# Patient Record
Sex: Male | Born: 1935 | Race: White | Hispanic: No | Smoking: Former smoker
Health system: Southern US, Community
[De-identification: ages and names within clinical notes are randomized; demographics above are authoritative.]

## PROBLEM LIST (undated history)

## (undated) DIAGNOSIS — M1990 Unspecified osteoarthritis, unspecified site: Secondary | ICD-10-CM

## (undated) DIAGNOSIS — Z8489 Family history of other specified conditions: Secondary | ICD-10-CM

## (undated) DIAGNOSIS — I5032 Chronic diastolic (congestive) heart failure: Secondary | ICD-10-CM

## (undated) DIAGNOSIS — I10 Essential (primary) hypertension: Secondary | ICD-10-CM

## (undated) DIAGNOSIS — E785 Hyperlipidemia, unspecified: Secondary | ICD-10-CM

## (undated) HISTORY — DX: Essential (primary) hypertension: I10

## (undated) HISTORY — PX: OTHER SURGICAL HISTORY: SHX169

## (undated) HISTORY — DX: Chronic diastolic (congestive) heart failure: I50.32

## (undated) HISTORY — PX: TOTAL KNEE ARTHROPLASTY: SHX125

## (undated) HISTORY — DX: Hyperlipidemia, unspecified: E78.5

---

## 2001-01-20 ENCOUNTER — Emergency Department (HOSPITAL_COMMUNITY): Admission: EM | Admit: 2001-01-20 | Discharge: 2001-01-20 | Payer: Self-pay | Admitting: Internal Medicine

## 2001-01-22 ENCOUNTER — Emergency Department (HOSPITAL_COMMUNITY): Admission: EM | Admit: 2001-01-22 | Discharge: 2001-01-22 | Payer: Self-pay | Admitting: Internal Medicine

## 2002-05-24 ENCOUNTER — Ambulatory Visit (HOSPITAL_COMMUNITY): Admission: RE | Admit: 2002-05-24 | Discharge: 2002-05-24 | Payer: Self-pay | Admitting: *Deleted

## 2002-05-24 ENCOUNTER — Encounter (INDEPENDENT_AMBULATORY_CARE_PROVIDER_SITE_OTHER): Payer: Self-pay

## 2004-06-27 ENCOUNTER — Emergency Department (HOSPITAL_COMMUNITY): Admission: EM | Admit: 2004-06-27 | Discharge: 2004-06-27 | Payer: Self-pay | Admitting: Emergency Medicine

## 2005-01-01 ENCOUNTER — Ambulatory Visit (HOSPITAL_COMMUNITY): Admission: RE | Admit: 2005-01-01 | Discharge: 2005-01-01 | Payer: Self-pay | Admitting: *Deleted

## 2005-01-01 ENCOUNTER — Encounter (INDEPENDENT_AMBULATORY_CARE_PROVIDER_SITE_OTHER): Payer: Self-pay | Admitting: Specialist

## 2005-08-14 ENCOUNTER — Ambulatory Visit (HOSPITAL_COMMUNITY): Admission: RE | Admit: 2005-08-14 | Discharge: 2005-08-14 | Payer: Self-pay | Admitting: Chiropractic Medicine

## 2006-04-13 ENCOUNTER — Ambulatory Visit (HOSPITAL_COMMUNITY): Admission: RE | Admit: 2006-04-13 | Discharge: 2006-04-14 | Payer: Self-pay | Admitting: Orthopaedic Surgery

## 2006-07-29 ENCOUNTER — Encounter: Admission: RE | Admit: 2006-07-29 | Discharge: 2006-07-29 | Payer: Self-pay | Admitting: Internal Medicine

## 2007-11-01 ENCOUNTER — Encounter: Admission: RE | Admit: 2007-11-01 | Discharge: 2007-11-01 | Payer: Self-pay | Admitting: Internal Medicine

## 2007-11-19 ENCOUNTER — Inpatient Hospital Stay (HOSPITAL_COMMUNITY): Admission: RE | Admit: 2007-11-19 | Discharge: 2007-11-22 | Payer: Self-pay | Admitting: Orthopedic Surgery

## 2008-04-08 ENCOUNTER — Encounter: Admission: RE | Admit: 2008-04-08 | Discharge: 2008-04-08 | Payer: Self-pay | Admitting: Orthopedic Surgery

## 2009-02-01 ENCOUNTER — Ambulatory Visit (HOSPITAL_COMMUNITY): Admission: RE | Admit: 2009-02-01 | Discharge: 2009-02-01 | Payer: Self-pay | Admitting: *Deleted

## 2010-11-15 ENCOUNTER — Other Ambulatory Visit: Payer: Self-pay | Admitting: General Surgery

## 2011-02-11 NOTE — Op Note (Signed)
NAME:  WADELL, CRADDOCK NO.:  [id number]   MEDICAL RECORD NO.:  [id number]          PATIENT TYPE:  AMB   LOCATION:  ENDO                         FACILITY:  [facility]   PHYSICIAN:  Georgiana Spinner, M.D.    DATE OF BIRTH:  May 15, 1921   DATE OF PROCEDURE:  02/01/2009  DATE OF DISCHARGE:                               OPERATIVE REPORT   PROCEDURE:  Colonoscopy.   INDICATIONS:  Colon polyps.   ANESTHESIA:  Fentanyl 50 mcg, Versed 5 mg.   DESCRIPTION OF PROCEDURE:  With the patient mildly sedated in the left  lateral decubitus position a rectal exam was performed which was  unremarkable.  Subsequently the Pentax videoscopic pediatric colonoscope  was inserted in the rectum, passed under direct vision to the cecum  identified by ileocecal valve and appendiceal orifice both which were  photographed.  From this point the colonoscope was slowly withdrawn  taking circumferential views of colonic mucosa stopping only in the  rectum which appeared normal on direct and showed hemorrhoids on  retroflexed view.  The endoscope was straightened and withdrawn.  The  patient's vital signs and pulse oximeter remained stable.  The patient  tolerated the procedure well without apparent complication.   FINDINGS:  Internal hemorrhoids, otherwise an unremarkable examination  other than diverticulosis of the sigmoid colon.   PLAN:  Will have the patient follow-up with me on as-needed basis only.           ______________________________  Georgiana Spinner, M.D.     GMO/MEDQ  D:  02/01/2009  T:  02/01/2009  Job:  [id number]

## 2011-02-11 NOTE — Op Note (Signed)
NAME:  Wesley [name], Wesley [name] NO.:  [id number]   MEDICAL RECORD NO.:  [id number]          PATIENT TYPE:  OIB   LOCATION:  3033                         FACILITY:  [facility]   PHYSICIAN:  Burnard Bunting, M.D.    DATE OF BIRTH:  08-Oct-1920   DATE OF PROCEDURE:  11/19/2007  DATE OF DISCHARGE:                               OPERATIVE REPORT   PREOPERATIVE DIAGNOSIS:  Broken left tibial bearing and worn patella.   POSTOPERATIVE DIAGNOSIS:  Broken left tibial bearing andworn patella.   PROCEDURE PERFORMED:  Left total knee arthroplasty, tibial bearing  removal and replacement with patella revision.   SURGEON:  Burnard Bunting, M.D.   ASSISTANT:  Jerolyn Shin. Tresa Res, M.D.   ANESTHESIA:  General endotracheal.   ESTIMATED BLOOD LOSS:  50 mL.   DRAINS:  Cultures x2.  Hemovac 1.   TOURNIQUET TIME:  Fifty-three (53) minutes at 300 mmHg.   INDICATION:  Wesley [name] is an 77 year old patient with left knee  replacement who developed sudden onset of pain.  Radiographs consistent  with broken bearing.  Presents now for operative management.   PROCEDURE IN DETAIL:  The patient was brought to the operating room  where general endotracheal anesthesia was introduced.  Preoperative  antibiotics were administered.  The left knee was prepped with DuraPrep  solution and draped in a sterile manner including the foot.  Collier Flowers was  used to cover the operative field.  The leg was elevated and  exsanguinated with an Esmarch wrap.  The tourniquet was inflated.  Anterior approach to the knee was utilized.  A median parapatellar  arthrotomy was made.  Cultures were obtained.  The femur and tibia were  not loose.  The tibial meniscal bearing was broken, and it was removed.  The lateral bearing was also removed.  Thorough irrigation and  debridement of synovial tissue was performed.  Broken pieces were  removed from the posterior aspect of the knee.  At this time, the knee  joint was thoroughly irrigated.   The tibial meniscal bearings were  replaced with a large tin meniscal bearing.  Patella revision was then  performed by replacing the patellar button.  The tourniquet was  released.  Bleeding points were encountered and responded to  electrocautery.  The knee joint was inferiorly irrigated and closed over  Hemovac drain using #1 Vicryl to reapproximate the arthrotomy, and zero  Vicryl, 2-0 Vicryl, and skin staples.  The knee was stable to range of  motion, varus and valgus stress at zero, 30, and 90 degrees.  Solution  of morphine and Marcaine plain injected into the knee. The patient  tolerated the procedure without immediate complications.  Dr. Lenny Pastel  assistance was required at all times throughout the case for positioning  and retraction of the neurovascular structures. His presence was a  medical necessity.      Burnard Bunting, M.D.  Electronically Signed     GSD/MEDQ  D:  11/19/2007  T:  11/20/2007  Job:  [id number]

## 2011-02-14 NOTE — Op Note (Signed)
   NAME:  NAZAR, KUAN NO.:  [id number]   MEDICAL RECORD NO.:  [id number]                   PATIENT TYPE:  AMB   LOCATION:  ENDO                                 FACILITY:  [facility]   PHYSICIAN:  Georgiana Spinner, M.D.                 DATE OF BIRTH:  [birth year]   DATE OF PROCEDURE:  DATE OF DISCHARGE:                                 OPERATIVE REPORT   PROCEDURE:  Colonoscopy with biopsy.   INDICATIONS:  Colon polyp.   ANESTHESIA:  Demerol 40 mg, Versed 4 mg.   DESCRIPTION OF PROCEDURE:  With the patient mildly sedated in the left  lateral decubitus position, the Olympus videoscopic colonoscope was inserted  in the rectum after a normal rectal exam, passed under direct vision to the  cecum, identified by ileocecal valve and appendiceal orifice.  From this  point the colonoscope was then slowly withdrawn, taking circumferential  views of the entire colonic mucosa, stopping in the sigmoid colon area,  where diverticulosis was noted, moderately severe, and  a small polyp was  seen at 40 cm, which was photographed and removed using hot biopsy forceps  technique, setting of 20-20 blended current, with pullback to the rectum,  which appeared normal on direct and showed hemorrhoids on retroflex view.  The endoscope was straightened and withdrawn.  The patient's vital signs and  pulse oximetry remained stable.  The patient tolerated the procedure well  without apparent complications.   FINDINGS:  1.Diverticulosis of the sigmoid colon.  1. Polyp at 40 cm from the anal verge.  2. Internal hemorrhoids.   PLAN:  Await biopsy report.  The patient will call me for results and follow  up with me as an outpatient.                                                Georgiana Spinner, M.D.    GMO/MEDQ  D:  05/24/2002  T:  05/26/2002  Job:  [id number]

## 2011-02-14 NOTE — Op Note (Signed)
NAME:  [name], Wesley NO.:  [id number]   MEDICAL RECORD NO.:  [id number]          PATIENT TYPE:  AMB   LOCATION:  ENDO                         FACILITY:  [facility]   PHYSICIAN:  Georgiana Spinner, M.D.    DATE OF BIRTH:  [birth year]   DATE OF PROCEDURE:  01/01/2005  DATE OF DISCHARGE:                                 OPERATIVE REPORT   PROCEDURE:  Colonoscopy with polypectomy.   INDICATIONS:  Colon polyps.   ANESTHESIA:  1.  Demerol 40.  2.  Versed 5 mg.   PROCEDURE:  With the patient mildly sedated in the left lateral decubitus  position, the Olympus videoscopic colonoscope was inserted into the rectum  and passed under direct vision after a normal rectal examination to the  cecum, identified by the ileocecal valve and appendiceal orifice, both of  which were photographed.  From this point, the colonoscope was slowly  withdrawn, taking circumferential views of the colonic mucosa, stopping in  the sigmoid colon, where a small polyp was seen on a stalk.  It was  photographed, and it was removed around the stalk using snare cautery  technique setting at 20/150 blended current.  The polyp was suctioned  through the end of the colonoscope and withdrawn, and the scope was then  reinserted to this level and withdrawn, taking circumferential views of the  remaining colonic mucosa, stopping in the rectum, which appeared normal on  direct and showed hemorrhoidal tissue on retroflexed view.  The endoscope  was straightened and withdrawn.  The patient's vital signs and pulse  oximeter remained stable.  The patient tolerated the procedure well, with no  apparent complications.   FINDINGS:  Diverticulosis of sigmoid colon, with polyp in this area removed.  Internal hemorrhoids.  Await biopsy report.  The patient will call me for  results and follow up with me as an outpatient.      GMO/MEDQ  D:  01/01/2005  T:  01/01/2005  Job:  [id number]

## 2011-02-14 NOTE — Discharge Summary (Signed)
NAME:  GILL, DELROSSI NO.:  [id number]   MEDICAL RECORD NO.:  [id number]          PATIENT TYPE:  INP   LOCATION:  3033                         FACILITY:  [facility]   PHYSICIAN:  Burnard Bunting, M.D.    DATE OF BIRTH:  1921-07-08   DATE OF ADMISSION:  11/19/2007  DATE OF DISCHARGE:  11/22/2007                               DISCHARGE SUMMARY   DISCHARGE DIAGNOSIS:  Left total knee arthroplasty bearing breakage.   SECONDARY DIAGNOSES:  1. History of right knee replacement.  2. Hypertension.  3. Cataracts and glaucoma.   OPERATIONS, PROCEDURES:  1. Left total knee arthroplasty bearing exchange and patellar      revision.   HOSPITAL COURSE:  Wesley [name] is an 77 year old patient who underwent  left TKA bearing exchange and patellar revision for broken bearing.  This was performed on November 19, 2007.  The patient tolerated the  procedure well without any complications.  He was put on Coumadin for  DVT prophylaxis and therapy for knee mobilization.  Hemoglobin was 12.3  on postop day #1.  Dorsiflexion and plantar flexion was intact.  Incision was intact on postop day #2.  The patient did not want to do  CPA machine in the hospital.  The patient had otherwise unremarkable  recovery and was discharged home in good condition.  On November 22, 2007, he will be weightbearing as tolerated.  He will undergo range of  motion as tolerated with home health PT.  He can do a straight leg raise  easily at the time of discharge.   DISCHARGE MEDICATIONS:  Include:  1. Amlodipine 5 mg p.o. daily.  2. Atenolol 25 mg p.o. daily.  3. Vitamin C 5 mg p.o. daily.  4. Aspirin 81 mg p.o. daily.  5. Coumadin 5 mg p.o. daily x4 weeks.  6. Eye drops.   He will follow up with me in 7 days for a suture removal.  He was also  discharged on Percocet 1 or 2 p.o. q.3-4 h. p.r.n. pain.      Burnard Bunting, M.D.  Electronically Signed     GSD/MEDQ  D:  01/04/2008  T:  01/05/2008  Job:   [id number]

## 2011-02-14 NOTE — Op Note (Signed)
NAME:  Wesley [name], Wesley [name] NO.:  [id number]   MEDICAL RECORD NO.:  [id number]          PATIENT TYPE:  INP   LOCATION:  2550                         FACILITY:  [facility]   PHYSICIAN:  Mark C. Ophelia Charter, M.D.    DATE OF BIRTH:  [birth year]   DATE OF PROCEDURE:  04/13/2006  DATE OF DISCHARGE:                                 OPERATIVE REPORT   PREOPERATIVE DIAGNOSIS:  Multilevel lumbar stenosis.   POSTOPERATIVE DIAGNOSIS:  Multilevel lumbar stenosis.   PROCEDURE:  1.  L2-3, L3-4, L4-5 decompression (three level).  2.  Foraminotomies.   ESTIMATED BLOOD LOSS:  800 mL.   PROCEDURE:  After induction of general anesthesia, the patient was placed  prone on chest rolls with careful padding and positioning, arms were up  above his head with no abduction greater than 90.  Back was prepped with  DuraPrep, antibiotics were given.  An incision was made after scoring the  area towels, Betadine biodrape and laminectomy  sheet and cross-table  lateral x-ray to confirm the appropriate levels.  Incision was made.  Subperiosteal dissection on the lamina was performed.  A three-level  __________ self-retaining retractor was placed.  This patient had severe  stenosis with greater than 75% canal narrowing at the 3-4 level, greater  than 50% compression of 4-5 and at 2-3.  This was multifactorial and thick  chunks of ligament were removed, which is primary cause, as well as facet  overhang.  An operative microscope was brought in and once sterile was  visualized and bone spurs removed from overhanging facets and thick chunks  of ligament was stripped up.  At the 3-4 level, there was adherence of the  posterior ligaments stuck down to the dura.  Microdissection had to be used  using the operative microscope with a Micro nerve hook and a 15-scalpel  blade to gradually peel the thick chunks which was causing a constriction  band hemispherically similar to a congenital constricting band on a digit.  Once this was peeled off the neural tube was rounded up nicely.  The bone  was removed out to the level of pedicles, foramens were enlarged.  Where  they were extremely tight, care was taken not to remove an excessive amount  of the facet.  Once the bone was removed out and the canal was visualized  falling away.  Disks were palpated and no microdiskectomy was performed, no  free fragments were noted and after irrigation with saline solution, deep  fascia was closed with 0-Vicryl, the operative microscope was removed, a  Hemovac drain was placed, superficial retinaculum was closed with 2-0  Vicryl, followed by skin staples, Marcaine infiltration and postoperative  dressing.  The instrument count and needle counts correct.      Mark C. Ophelia Charter, M.D.  Electronically Signed     MCY/MEDQ  D:  04/13/2006  T:  04/14/2006  Job:  [id number]

## 2011-06-20 LAB — BASIC METABOLIC PANEL
BUN: 17
BUN: 19
CO2: 30
CO2: 30
Calcium: 8.4
Calcium: 8.6
Calcium: 9.1
Chloride: 104
Chloride: 104
Creatinine, Ser: 1.05
Creatinine, Ser: 1.15
Creatinine, Ser: 1.32
GFR calc Af Amer: >60
GFR calc Af Amer: >60
GFR calc Af Amer: >60
GFR calc non Af Amer: >60
GFR calc non Af Amer: >60
Glucose, Bld: 110 — ABNORMAL HIGH
Glucose, Bld: 144 — ABNORMAL HIGH
Potassium: 4.1
Potassium: 4.4
Sodium: 140
Sodium: 142

## 2011-06-20 LAB — APTT: aPTT: 33

## 2011-06-20 LAB — CBC
HCT: 40.3
Hemoglobin: 12.5 — ABNORMAL LOW
Hemoglobin: 13.8
MCHC: 34.2
MCV: 88.8
MCV: 89.7
Platelets: 155
Platelets: 199
RBC: 3.81 — ABNORMAL LOW
RBC: 4.08 — ABNORMAL LOW
RBC: 4.09 — ABNORMAL LOW
RBC: 4.54
RDW: 12.9
RDW: 13.3
WBC: 10.5
WBC: 11.1 — ABNORMAL HIGH
WBC: 9.6

## 2011-06-20 LAB — PROTIME-INR
INR: 1
INR: 1.1
INR: 1.1
INR: 1.2
INR: 1.4
Prothrombin Time: 13.7
Prothrombin Time: 14.2
Prothrombin Time: 14.4
Prothrombin Time: 15.1
Prothrombin Time: 17.9 — ABNORMAL HIGH

## 2011-06-20 LAB — BODY FLUID CULTURE
Culture: NO GROWTH
Culture: NO GROWTH
Gram Stain: NONE SEEN

## 2011-06-20 LAB — ANAEROBIC CULTURE: Gram Stain: NONE SEEN

## 2011-06-20 LAB — DIFFERENTIAL
Basophils Absolute: 0
Basophils Relative: 0
Eosinophils Absolute: 0.4
Eosinophils Relative: 4
Lymphocytes Relative: 18
Lymphs Abs: 1.7
Monocytes Absolute: 0.7
Monocytes Relative: 7
Neutro Abs: 6.8
Neutrophils Relative %: 71

## 2011-06-20 LAB — TYPE AND SCREEN
ABO/RH(D): O POS
Antibody Screen: NEGATIVE

## 2011-06-20 LAB — ABO/RH: ABO/RH(D): O POS

## 2012-08-24 ENCOUNTER — Encounter (INDEPENDENT_AMBULATORY_CARE_PROVIDER_SITE_OTHER): Payer: Self-pay | Admitting: General Surgery

## 2012-08-24 ENCOUNTER — Ambulatory Visit (INDEPENDENT_AMBULATORY_CARE_PROVIDER_SITE_OTHER): Payer: Medicare Other | Admitting: General Surgery

## 2012-08-24 VITALS — BP 128/83 | HR 70 | Temp 98.6°F | Resp 18 | Ht 69.0 in | Wt 236.6 lb

## 2012-08-24 DIAGNOSIS — L723 Sebaceous cyst: Secondary | ICD-10-CM

## 2012-08-24 NOTE — Progress Notes (Signed)
Subjective:     Patient ID: Wesley [name], male   DOB: September 16, 1921, 78 y.o.   MRN: [id number]  HPI The patient is a 78 year old male with a several week history of a lower back infected sebaceous cyst. Patient was seen by his primary care doctor and had this incised and drained and some antibiotics. Patient has no antibiotics at this time. There is minimal drainage no fluctuance it to be pouting at this time. He states that previous sebaceous cyst removed in clinic.  Review of Systems  Constitutional: Negative.  Negative for fever.       Objective:   Physical Exam  Constitutional: He is oriented to person, place, and time. He appears well-developed and well-nourished.  HENT:  Head: Normocephalic and atraumatic.  Eyes: Conjunctivae normal and EOM are normal. Pupils are equal, round, and reactive to light.  Neck: Normal range of motion. Neck supple.  Cardiovascular: Normal rate and regular rhythm.   Pulmonary/Chest: Effort normal and breath sounds normal.    Abdominal: Soft.  Musculoskeletal: Normal range of motion.  Neurological: He is alert and oriented to person, place, and time.       Assessment:     78 year old male with a healing infected sebaceous cyst    Plan:     1. At this time there's no infection or sebaceous cyst that could be palpated on exam. I discussed with the patient should this sebaceous cyst reaccumulate he should call for an appointment to have this removed under local in clinic. The patient agreed with this plan.

## 2013-01-05 ENCOUNTER — Other Ambulatory Visit: Payer: Self-pay | Admitting: Internal Medicine

## 2013-01-05 DIAGNOSIS — R748 Abnormal levels of other serum enzymes: Secondary | ICD-10-CM

## 2013-01-10 ENCOUNTER — Ambulatory Visit
Admission: RE | Admit: 2013-01-10 | Discharge: 2013-01-10 | Disposition: A | Discharge status: Home / Self Care | Payer: Medicare Other | Source: Ambulatory Visit | Attending: Internal Medicine | Admitting: Internal Medicine

## 2013-01-10 DIAGNOSIS — R748 Abnormal levels of other serum enzymes: Secondary | ICD-10-CM

## 2013-07-13 ENCOUNTER — Other Ambulatory Visit: Payer: Self-pay | Admitting: Internal Medicine

## 2013-07-13 DIAGNOSIS — M869 Osteomyelitis, unspecified: Secondary | ICD-10-CM

## 2013-07-14 ENCOUNTER — Ambulatory Visit
Admission: RE | Admit: 2013-07-14 | Discharge: 2013-07-14 | Disposition: A | Discharge status: Home / Self Care | Payer: Medicare Other | Source: Ambulatory Visit | Attending: Internal Medicine | Admitting: Internal Medicine

## 2013-07-14 DIAGNOSIS — M869 Osteomyelitis, unspecified: Secondary | ICD-10-CM

## 2013-07-14 MED ORDER — GADOBENATE DIMEGLUMINE 529 MG/ML IV SOLN
20.0000 mL | Freq: Once | INTRAVENOUS | Status: AC | PRN
  Administered 2013-07-14: 20 mL via INTRAVENOUS

## 2014-11-08 DIAGNOSIS — H4011X2 Primary open-angle glaucoma, moderate stage: Secondary | ICD-10-CM | POA: Diagnosis not present

## 2014-12-12 DIAGNOSIS — R609 Edema, unspecified: Secondary | ICD-10-CM | POA: Diagnosis not present

## 2014-12-12 DIAGNOSIS — I10 Essential (primary) hypertension: Secondary | ICD-10-CM | POA: Diagnosis not present

## 2015-01-25 DIAGNOSIS — Z0000 Encounter for general adult medical examination without abnormal findings: Secondary | ICD-10-CM | POA: Diagnosis not present

## 2015-01-25 DIAGNOSIS — R0602 Shortness of breath: Secondary | ICD-10-CM | POA: Diagnosis not present

## 2015-01-25 DIAGNOSIS — I10 Essential (primary) hypertension: Secondary | ICD-10-CM | POA: Diagnosis not present

## 2015-01-25 DIAGNOSIS — Z1389 Encounter for screening for other disorder: Secondary | ICD-10-CM | POA: Diagnosis not present

## 2015-01-25 DIAGNOSIS — R609 Edema, unspecified: Secondary | ICD-10-CM | POA: Diagnosis not present

## 2015-02-08 DIAGNOSIS — M1990 Unspecified osteoarthritis, unspecified site: Secondary | ICD-10-CM | POA: Diagnosis not present

## 2015-02-08 DIAGNOSIS — R609 Edema, unspecified: Secondary | ICD-10-CM | POA: Diagnosis not present

## 2015-02-08 DIAGNOSIS — I10 Essential (primary) hypertension: Secondary | ICD-10-CM | POA: Diagnosis not present

## 2015-02-15 DIAGNOSIS — R609 Edema, unspecified: Secondary | ICD-10-CM | POA: Diagnosis not present

## 2015-02-15 DIAGNOSIS — R0602 Shortness of breath: Secondary | ICD-10-CM | POA: Diagnosis not present

## 2015-02-28 ENCOUNTER — Telehealth: Payer: Self-pay | Admitting: Cardiovascular Disease

## 2015-02-28 NOTE — Telephone Encounter (Signed)
Received records from [facility] for appointment on 05/07/15 with Dr Sallyanne Kuster.  Records given to [facility] (medical records) for Dr Croitoru's schedule on 05/07/15. lp

## 2015-04-17 DIAGNOSIS — R609 Edema, unspecified: Secondary | ICD-10-CM | POA: Diagnosis not present

## 2015-04-24 DIAGNOSIS — I10 Essential (primary) hypertension: Secondary | ICD-10-CM | POA: Diagnosis not present

## 2015-05-07 ENCOUNTER — Ambulatory Visit (INDEPENDENT_AMBULATORY_CARE_PROVIDER_SITE_OTHER): Payer: Self-pay | Admitting: Cardiovascular Disease

## 2015-05-07 ENCOUNTER — Encounter: Payer: Self-pay | Admitting: Cardiovascular Disease

## 2015-05-07 ENCOUNTER — Encounter: Payer: Self-pay | Admitting: *Deleted

## 2015-05-07 VITALS — BP 146/70 | HR 70 | Resp 16 | Ht 68.0 in | Wt 229.5 lb

## 2015-05-07 DIAGNOSIS — Z8619 Personal history of other infectious and parasitic diseases: Secondary | ICD-10-CM

## 2015-05-07 DIAGNOSIS — E785 Hyperlipidemia, unspecified: Secondary | ICD-10-CM

## 2015-05-07 DIAGNOSIS — R0600 Dyspnea, unspecified: Secondary | ICD-10-CM

## 2015-05-07 DIAGNOSIS — I5032 Chronic diastolic (congestive) heart failure: Secondary | ICD-10-CM

## 2015-05-07 DIAGNOSIS — I10 Essential (primary) hypertension: Secondary | ICD-10-CM

## 2015-05-07 HISTORY — DX: Essential (primary) hypertension: I10

## 2015-05-07 HISTORY — DX: Hyperlipidemia, unspecified: E78.5

## 2015-05-07 HISTORY — DX: Chronic diastolic (congestive) heart failure: I50.32

## 2015-05-07 NOTE — Patient Instructions (Signed)
Your physician wants you to follow-up in: [place] will receive a reminder letter in the mail two months in advance. If you don't receive a letter, please call our office to schedule the follow-up appointment.   Your physician has requested that you have a lexiscan myoview. For further information please visit [email or web address]. Please follow instruction sheet, as given.

## 2015-05-07 NOTE — Progress Notes (Signed)
Patient ID: Wesley [name], male   DOB: Oct 09, 1920, 81 y.o.   MRN: [id number]     Cardiology Office Note   Date:  05/07/2015   ID:  Wesley [name], DOB 10-01-1920, MRN [id number]  PCP:  Horatio Pel, MD  Cardiologist:   Sanda Klein, MD   Chief Complaint  Patient presents with  . New Evaluation    patient reports shortness of breath on minimal exertion, swelling legs/ankles      History of Present Illness: Wesley [name] is a 81 y.o. male who presents for  Recently diagnosed diastolic heart failure.  Wesley [name] has been remarkably healthy for all of his life but has a very long history of treated systemic hypertension. Additional problems include mixed hyperlipidemia , degenerative joint disease, nephrolithiasis and shingles. Roughly 3 years ago an echocardiogram was performed for hypertension and left ventricular hypertrophy. The study confirmed the presence of severe LVH( 1.6-1.9 cm) with preserved left ventricular systolic function and a moderately dilated left atrium without significant valvular abnormalities. There was mild diastolic dysfunction, grade 1. In May of this year repeat echocardiogram performed for dyspnea and edema shows similar structural abnormalities but shows evidence of elevated left atrial filling pressure with a grade 2 diastolic dysfunction. Again there are no significant valvular abnormalities. Wall thickness was even more prominent at 2.1 cm.   his exertional dyspnea and edema improved immediately after initiation of diuretic therapy with furosemide and the problems have not returned as long as he takes this medication.  Denies dizziness, palpitations, syncope and has not experienced chest tightness.    Past Medical History  Diagnosis Date  . Hypertension   . Hyperlipidemia 05/07/2015  . Chronic diastolic heart failure 0/03/6807  . Essential hypertension 05/07/2015    Past Surgical History  Procedure Laterality Date  . Lower back    . Total knee  arthroplasty Bilateral      Current Outpatient Prescriptions  Medication Sig Dispense Refill  . amLODipine-valsartan (EXFORGE) 5-320 MG per tablet Take 1 tablet by mouth daily.    Marland Kitchen aspirin 81 MG tablet Take 81 mg by mouth daily.    Marland Kitchen BRIMONIDINE TARTRATE OP Place 1 drop into both eyes every morning.    . Calcium Carbonate-Vitamin D (CALCIUM-VITAMIN D) 500-200 MG-UNIT per tablet Take 1 tablet by mouth 2 (two) times daily with a meal.    . DHA-EPA-Flaxseed Oil-Vitamin E (THERA TEARS NUTRITION PO) Place 1 drop into both eyes 4 (four) times daily as needed.    . furosemide (LASIX) 40 MG tablet Take 40 mg by mouth every other day.  2  . latanoprost (XALATAN) 0.005 % ophthalmic solution Place 1 drop into both eyes at bedtime.    Marland Kitchen losartan-hydrochlorothiazide (HYZAAR) 100-12.5 MG per tablet     . Tamsulosin HCl (FLOMAX) 0.4 MG CAPS     . TRAVATAN Z 0.004 % SOLN ophthalmic solution     . vitamin C (ASCORBIC ACID) 500 MG tablet Take 500 mg by mouth daily.     No current facility-administered medications for this visit.    Allergies:   Penicillins    Social History:  The patient  reports that he has quit smoking. He does not have any smokeless tobacco history on file. He reports that he does not drink alcohol or use illicit drugs.   Family History:  The patient's family history includes Diabetes in his son; Heart Problems in his mother; Hypertension in his daughter; Kidney disease in his sister; Stroke in his father.  ROS:  Please see the history of present illness.    Otherwise, review of systems positive for  Joint aches mild heart appearing. He does not have any memory problems, focal neurological deficits, fever, chills, cough, hemoptysis, wheezing , abdominal pain, change in bowel pattern, nausea or vomiting, hematuria or dysuria, polyuria, polydipsia, intolerance to heat or cold, mood swings , rashes or pruritus , change in visual acuity.   All other systems are reviewed and negative.      PHYSICAL EXAM: VS:  BP 146/70 mmHg  Pulse 70  Resp 16  Ht 5\' 8"  (1.727 m)  Wt 229 lb 8 oz (104.101 kg)  BMI 34.90 kg/m2 , BMI Body mass index is 34.9 kg/(m^2).  General: Alert, oriented x3, no distress Head: no evidence of trauma, PERRL, EOMI, no exophtalmos or lid lag, no myxedema, no xanthelasma; normal ears, nose and oropharynx Neck: normal jugular venous pulsations and no hepatojugular reflux; brisk carotid pulses without delay and no carotid bruits Chest: clear to auscultation, no signs of consolidation by percussion or palpation, normal fremitus, symmetrical and full respiratory excursions Cardiovascular: normal position and quality of the apical impulse, regular rhythm, normal first and widely split second heart sounds, no murmurs, rubs or gallops Abdomen: no tenderness or distention, no masses by palpation, no abnormal pulsatility or arterial bruits, normal bowel sounds, no hepatosplenomegaly Extremities: no clubbing, cyanosis or edema; 2+ radial, ulnar and brachial pulses bilaterally; 2+ right femoral, posterior tibial and dorsalis pedis pulses; 2+ left femoral, posterior tibial and dorsalis pedis pulses; no subclavian or femoral bruits Neurological: grossly nonfocal Psych: euthymic mood, full affect   EKG:  EKG is ordered today. The ekg ordered today demonstrates  Sinus rhythm,  Trifascicular block (right bundle branch block, left axis deviation due to anterior fascicular block, first-degree AV block PR 234 ms ) QTC 481 ms, no repolarization abnormalities. Normal QRS voltage.   Recent Labs:  labs from [facility] show normal SPEP without M spike , hemoglobin 13.7 , creatinine 1.28 , normal liver function tests , total cholesterol 175, HDL 51, LDL 110, triglycerides 68 , TSH 2.80  Wt Readings from Last 3 Encounters:  05/07/15 229 lb 8 oz (104.101 kg)  08/24/12 236 lb 9.6 oz (107.321 kg)      Other studies Reviewed: Additional studies/ records that were reviewed  today include:  Notes from Dr. Minna Antis, echo reports from 2013 2016 , labs.   ASSESSMENT AND PLAN:   Wesley [name] has recent onset diastolic heart failure with objective evidence of elevated left heart filling pressures by echocardiography. He has responded very well to diuretic therapy.   He has a long-standing history of systemic hypertension and may simply have hypertensive heart disease. However, the absence of significant voltage increase on his electrocardiogram despite the presence of severe LVH by echo suggests a possible infiltrative process such as cardiac amyloidosis. Making a firm diagnosis of this disorder would not have major impact on the treatment protocol. Considering that he is 81 years old, a firm diagnosis of cardiac amyloidosis will also not have much of a impact on his prognosis.   I think it remains to be proven that his heart failure is not a manifestation of coronary artery disease and have recommended a pharmacological nuclear perfusion study.  Unless there is a large reversible defect, I don't think coronary angiography will be a preferred test. He is already receiving diuretic therapy and his blood pressure is now well controlled.   We spent a long time  discussing the importance of sodium restriction, daily weight monitoring and the signs of symptoms of heart failure exacerbation. His daughter prepares most of his meals and was present at the office visit today so that she heard these recommendations. He is willing to be compliant with weight monitoring. His home scale in our office scale seem to match almost exactly at 229 pounds today.  Current medicines are reviewed at length with the patient today.  The patient does not have concerns regarding medicines.  The following changes have been made:  no change  Labs/ tests ordered today include:  Orders Placed This Encounter  Procedures  . Myocardial Perfusion Imaging  . EKG 12-Lead     Patient Instructions  Your physician  wants you to follow-up in: [place] will receive a reminder letter in the mail two months in advance. If you don't receive a letter, please call our office to schedule the follow-up appointment.   Your physician has requested that you have a lexiscan myoview. For further information please visit [email or web address]. Please follow instruction sheet, as given.       Mikael Spray, MD  05/07/2015 5:00 PM    Sanda Klein, MD, [facility] [phone] office [phone] pager

## 2015-05-10 DIAGNOSIS — H4011X2 Primary open-angle glaucoma, moderate stage: Secondary | ICD-10-CM | POA: Diagnosis not present

## 2015-05-15 ENCOUNTER — Telehealth (HOSPITAL_COMMUNITY): Payer: Self-pay

## 2015-05-15 NOTE — Telephone Encounter (Signed)
Encounter complete. 

## 2015-05-17 ENCOUNTER — Ambulatory Visit (HOSPITAL_COMMUNITY)
Admission: RE | Admit: 2015-05-17 | Discharge: 2015-05-17 | Disposition: A | Discharge status: Home / Self Care | Payer: Medicare Other | Source: Ambulatory Visit | Attending: Cardiology | Admitting: Cardiology

## 2015-05-17 DIAGNOSIS — R0600 Dyspnea, unspecified: Secondary | ICD-10-CM

## 2015-05-17 DIAGNOSIS — I10 Essential (primary) hypertension: Secondary | ICD-10-CM | POA: Diagnosis not present

## 2015-05-17 DIAGNOSIS — F17200 Nicotine dependence, unspecified, uncomplicated: Secondary | ICD-10-CM | POA: Diagnosis not present

## 2015-05-17 DIAGNOSIS — R9439 Abnormal result of other cardiovascular function study: Secondary | ICD-10-CM | POA: Insufficient documentation

## 2015-05-17 DIAGNOSIS — I517 Cardiomegaly: Secondary | ICD-10-CM | POA: Diagnosis not present

## 2015-05-17 DIAGNOSIS — I4510 Unspecified right bundle-branch block: Secondary | ICD-10-CM | POA: Diagnosis not present

## 2015-05-17 LAB — MYOCARDIAL PERFUSION IMAGING
CHL CUP NUCLEAR SDS: 1
CHL CUP RESTING HR STRESS: 67 {beats}/min
CSEPPHR: 68 {beats}/min
LV dias vol: 146 mL
LVSYSVOL: 78 mL
SRS: 2
SSS: 3
TID: 1.12

## 2015-05-17 MED ORDER — TECHNETIUM TC 99M SESTAMIBI GENERIC - CARDIOLITE
10.8000 | Freq: Once | INTRAVENOUS | Status: AC | PRN
  Administered 2015-05-17: 11 via INTRAVENOUS

## 2015-05-17 MED ORDER — TECHNETIUM TC 99M SESTAMIBI GENERIC - CARDIOLITE
31.2000 | Freq: Once | INTRAVENOUS | Status: AC | PRN
  Administered 2015-05-17: 31.2 via INTRAVENOUS

## 2015-05-17 MED ORDER — REGADENOSON 0.4 MG/5ML IV SOLN
0.4000 mg | Freq: Once | INTRAVENOUS | Status: AC
  Administered 2015-05-17: 0.4 mg via INTRAVENOUS

## 2015-07-20 DIAGNOSIS — M25551 Pain in right hip: Secondary | ICD-10-CM | POA: Diagnosis not present

## 2015-07-24 ENCOUNTER — Other Ambulatory Visit: Payer: Self-pay | Admitting: Orthopedic Surgery

## 2015-07-24 DIAGNOSIS — M25551 Pain in right hip: Secondary | ICD-10-CM

## 2015-07-25 ENCOUNTER — Ambulatory Visit
Admission: RE | Admit: 2015-07-25 | Discharge: 2015-07-25 | Disposition: A | Discharge status: Home / Self Care | Payer: Medicare Other | Source: Ambulatory Visit | Attending: Orthopedic Surgery | Admitting: Orthopedic Surgery

## 2015-07-25 DIAGNOSIS — M25551 Pain in right hip: Secondary | ICD-10-CM | POA: Diagnosis not present

## 2015-08-08 ENCOUNTER — Other Ambulatory Visit: Payer: Medicare Other

## 2015-09-05 DIAGNOSIS — H401122 Primary open-angle glaucoma, left eye, moderate stage: Secondary | ICD-10-CM | POA: Diagnosis not present

## 2015-09-05 DIAGNOSIS — H401112 Primary open-angle glaucoma, right eye, moderate stage: Secondary | ICD-10-CM | POA: Diagnosis not present

## 2015-10-23 DIAGNOSIS — Z0000 Encounter for general adult medical examination without abnormal findings: Secondary | ICD-10-CM | POA: Diagnosis not present

## 2015-10-23 DIAGNOSIS — I10 Essential (primary) hypertension: Secondary | ICD-10-CM | POA: Diagnosis not present

## 2015-10-23 DIAGNOSIS — J811 Chronic pulmonary edema: Secondary | ICD-10-CM | POA: Diagnosis not present

## 2015-10-23 DIAGNOSIS — N390 Urinary tract infection, site not specified: Secondary | ICD-10-CM | POA: Diagnosis not present

## 2015-10-23 DIAGNOSIS — N183 Chronic kidney disease, stage 3 (moderate): Secondary | ICD-10-CM | POA: Diagnosis not present

## 2015-10-23 DIAGNOSIS — E559 Vitamin D deficiency, unspecified: Secondary | ICD-10-CM | POA: Diagnosis not present

## 2015-10-23 DIAGNOSIS — I110 Hypertensive heart disease with heart failure: Secondary | ICD-10-CM | POA: Diagnosis not present

## 2015-10-23 DIAGNOSIS — Z0001 Encounter for general adult medical examination with abnormal findings: Secondary | ICD-10-CM | POA: Diagnosis not present

## 2015-10-23 DIAGNOSIS — Z1212 Encounter for screening for malignant neoplasm of rectum: Secondary | ICD-10-CM | POA: Diagnosis not present

## 2015-10-24 ENCOUNTER — Telehealth: Payer: Self-pay | Admitting: Cardiovascular Disease

## 2015-10-24 NOTE — Telephone Encounter (Signed)
Received records from [facility] for appointment on 11/21/15 with Dr Sallyanne Kuster.   Records given to [facility] (medical records) for Dr Croitoru's schedule. lp

## 2015-10-26 DIAGNOSIS — N183 Chronic kidney disease, stage 3 (moderate): Secondary | ICD-10-CM | POA: Diagnosis not present

## 2015-10-26 DIAGNOSIS — E875 Hyperkalemia: Secondary | ICD-10-CM | POA: Diagnosis not present

## 2015-10-26 DIAGNOSIS — I509 Heart failure, unspecified: Secondary | ICD-10-CM | POA: Diagnosis not present

## 2015-10-30 DIAGNOSIS — N183 Chronic kidney disease, stage 3 (moderate): Secondary | ICD-10-CM | POA: Diagnosis not present

## 2015-10-30 DIAGNOSIS — I509 Heart failure, unspecified: Secondary | ICD-10-CM | POA: Diagnosis not present

## 2015-11-01 NOTE — Progress Notes (Addendum)
Cardiology Office Note   Date:  11/02/2015   ID:  Wesley [name], DOB 1920/11/20, MRN [id number]  PCP:  Horatio Pel, MD  Cardiologist:  Dr. Sallyanne Kuster    6 month follow up- acute on chronic diastolic CHF   History of Present Illness: Wesley [name] is a 82 y.o. male with a history of HTN, HLD, and chronic diastolic CHF who presents to clinic for evaluation of 20 lbs weight gain, SOB and LE edema.    Wesley [name] has been remarkably healthy for all of his life but has a very long history of treated systemic hypertension. Roughly 3 years ago an echocardiogram was performed for hypertension and left ventricular hypertrophy. The study confirmed the presence of severe LVH( 1.6-1.9 cm) with preserved left ventricular systolic function and a moderately dilated left atrium without significant valvular abnormalities. There was G1DD. In 01/2015 repeat echocardiogram performed for dyspnea and edema that showed similar structural abnormalities but showed evidence of elevated left atrial filling pressure with a grade 2 diastolic dysfunction. Again there were no significant valvular abnormalities. Wall thickness was even more prominent at 2.1 cm. His exertional dyspnea and edema improved immediately after initiation of diuretic therapy with furosemide and the problems have not returned as long as he takes this medication  Last seen by Dr. Sallyanne Kuster in 04/2015. He felt that Wesley [name] may just have hypertensive heart disease. However, the absence of significant voltage increase on his ECG despite the presence of severe LVH by echo suggests a possible infiltrative process such as cardiac amyloidosis. However, he didn't feel that making a firm diagnosis of this disorder would have major impact on the treatment protocol in this 82 year old. He did want to rule out ischemia as a cause of his CHF and recommended a pharmacological nuclear perfusion study. Unless there was a large reversible defect, He did not think  coronary angiography would be a preferred test. He is already receiving diuretic therapy and his blood pressure is now well controlled. He had a nuclear stress test on 05/17/15 which showed no signs of severe or extensive coronary blockages. LVEF assessment was mildly lower than that obtained by echo, but felt to be a possible technical issue. No further tests planned at this time.  Today he presents to clinic for follow up. He has been seeing Dr Shelia Media his PCP for the past couple weeks for management of increased weight and fluid gain. I have no access to his labs or imaging but per daughter report he fluid on his lungs and around his heart. His lasix was increased from 40mg  QOD to daily, with not much weight loss. He has LE edema with profuse weeping. His weight is now 249 up from 229 when he last saw Dr. Sallyanne Kuster in August. Apparently his potassium was severely elevated and he was told to stop his spironolactone and Hyzaar (which were recently started by PCP since last seeing [facility]- not sure why he is on two ARBs with Hyzaar and Exforge)    Past Medical History  Diagnosis Date  . Hypertension   . Hyperlipidemia 05/07/2015  . Chronic diastolic heart failure ([place]) 05/07/2015  . Essential hypertension 05/07/2015    Past Surgical History  Procedure Laterality Date  . Lower back    . Total knee arthroplasty Bilateral      Current Outpatient Prescriptions  Medication Sig Dispense Refill  . amLODipine-valsartan (EXFORGE) 5-320 MG per tablet Take 1 tablet by mouth daily.    Marland Kitchen aspirin  81 MG tablet Take 81 mg by mouth daily.    Marland Kitchen BRIMONIDINE TARTRATE OP Place 1 drop into both eyes every morning.    . Calcium Carbonate-Vitamin D (CALCIUM-VITAMIN D) 500-200 MG-UNIT per tablet Take 1 tablet by mouth 2 (two) times daily with a meal.    . furosemide (LASIX) 40 MG tablet Take 40 mg by mouth every other day.  2  . latanoprost (XALATAN) 0.005 % ophthalmic solution Place 1 drop into both eyes at bedtime.      . Tamsulosin HCl (FLOMAX) 0.4 MG CAPS Take 0.4 mg by mouth daily.     . TRAVATAN Z 0.004 % SOLN ophthalmic solution Place 1 drop into both eyes at bedtime.     . vitamin C (ASCORBIC ACID) 500 MG tablet Take 500 mg by mouth daily.     No current facility-administered medications for this visit.    Allergies:   Penicillins    Social History:  The patient  reports that he has quit smoking. He does not have any smokeless tobacco history on file. He reports that he does not drink alcohol or use illicit drugs.   Family History:  The patient'sfamily history includes Diabetes in his son; Heart Problems in his mother; Hypertension in his daughter; Kidney disease in his sister; Stroke in his father.    ROS:  Please see the history of present illness.   Otherwise, review of systems are positive for NONE.   All other systems are reviewed and negative.    PHYSICAL EXAM: VS:  BP 144/68 mmHg  Pulse 58  Ht 5\' 8"  (1.727 m)  Wt 249 lb 12.8 oz (113.309 kg)  BMI 37.99 kg/m2 , BMI Body mass index is 37.99 kg/(m^2). GEN: Well nourished, well developed, in no acute distress HEENT: normal Neck: +JVD, carotid bruits, or masses Cardiac: RRR; no murmurs, rubs, or gallops, 3+ weeping pitting edema up to thighs.  Respiratory: crackles at bases bilaterally, normal work of breathing GI: soft, nontender, ++distended, + BS MS: no deformity or atrophy Skin: warm and dry, no rash Neuro:  Strength and sensation are intact Psych: euthymic mood, full affect   EKG:  EKG is not ordered today.   Recent Labs: No results found for requested labs within last 365 days.    Lipid Panel No results found for: CHOL, TRIG, HDL, CHOLHDL, VLDL, LDLCALC, LDLDIRECT    Wt Readings from Last 3 Encounters:  11/02/15 249 lb 12.8 oz (113.309 kg)  05/17/15 229 lb (103.874 kg)  05/07/15 229 lb 8 oz (104.101 kg)      Other studies Reviewed: Additional studies/ records that were reviewed today include: MYOVIEW, 2D ECHO (  see HPI) Review of the above records demonstrates:   Lexiscan myoview (05/17/15):    The left ventricular ejection fraction is mildly decreased (45-54%).  Nuclear stress EF: 46%.  There was no ST segment deviation noted during stress.  This is a low risk study.  Low risk stress nuclear study with a small, mild, reversible inferior basal defect of borderline significance (cannot R/O very mild ischemia); otherwise normal perfusion; EF 46 but visually appears better; suggest echo to further assess; mild LVE.    ASSESSMENT AND PLAN: Wesley [name] is a 82 y.o. male with a history of HTN, HLD, and chronic diastolic CHF who presents to clinic for evaluation of 20 lbs weight gain, SOB and LE edema.    Acute on chronic diastolic CHF: he is massively volume overloaded with 3+ weeping pitting edema up  to thighs, elevated JVD and crackles on lung exam. PLAN: admit from office to hospital. Will start him on 80mg  IV lasix BID. Will get a 2D ECHO, CXR, BMET and CBC. Apparently his K and creatinine have been elevated, so we will continue to hold his ARB. If normalized we can add this back.   HTN: continue amlodipine, stopping valsartan ( in Exforge combo) due to hyperkalemia. Will increase amlodipine from 5mg  to 10mg  while off ARB ( although this may not be good long term due to LE edema). Can titrate meds while in hospital  CKD: creat elevated per PCP, will check BMET when admitted. We will have to watch carefully in the setting of IV diruesis.   Current medicines are reviewed at length with the patient today.  The patient does not have concerns regarding medicines.  The following changes have been made:  Admit for IV diuresis. Start lasix 80mg  IV BID. Will need potassium supplementation based on labs.   Labs/ tests ordered today include:  No orders of the defined types were placed in this encounter.    Disposition:   FU with Dr. Sallyanne Kuster when discharged from hospital     Signed, Crista Luria  11/02/2015 2:11 PM    [address] [place], [place], [place]  [address] Phone: [phone]; Fax: [phone]

## 2015-11-02 ENCOUNTER — Telehealth: Payer: Self-pay | Admitting: *Deleted

## 2015-11-02 ENCOUNTER — Ambulatory Visit (INDEPENDENT_AMBULATORY_CARE_PROVIDER_SITE_OTHER): Payer: Medicare Other | Admitting: Physician Assistant

## 2015-11-02 ENCOUNTER — Encounter: Payer: Self-pay | Admitting: Physician Assistant

## 2015-11-02 ENCOUNTER — Inpatient Hospital Stay (HOSPITAL_COMMUNITY)
Admission: AD | Admit: 2015-11-02 | Discharge: 2015-11-08 | DRG: 291 | Disposition: A | Discharge status: Home / Self Care | Payer: Medicare Other | Source: Ambulatory Visit | Attending: Cardiology | Admitting: Cardiology

## 2015-11-02 ENCOUNTER — Encounter (HOSPITAL_COMMUNITY): Payer: Self-pay | Admitting: General Practice

## 2015-11-02 ENCOUNTER — Observation Stay (HOSPITAL_COMMUNITY): Payer: Medicare Other

## 2015-11-02 VITALS — BP 144/68 | HR 58 | Ht 68.0 in | Wt 249.8 lb

## 2015-11-02 DIAGNOSIS — I272 Other secondary pulmonary hypertension: Secondary | ICD-10-CM | POA: Diagnosis present

## 2015-11-02 DIAGNOSIS — I5043 Acute on chronic combined systolic (congestive) and diastolic (congestive) heart failure: Secondary | ICD-10-CM | POA: Diagnosis not present

## 2015-11-02 DIAGNOSIS — Z888 Allergy status to other drugs, medicaments and biological substances status: Secondary | ICD-10-CM

## 2015-11-02 DIAGNOSIS — I110 Hypertensive heart disease with heart failure: Secondary | ICD-10-CM

## 2015-11-02 DIAGNOSIS — I080 Rheumatic disorders of both mitral and aortic valves: Secondary | ICD-10-CM | POA: Diagnosis not present

## 2015-11-02 DIAGNOSIS — Z79899 Other long term (current) drug therapy: Secondary | ICD-10-CM | POA: Diagnosis not present

## 2015-11-02 DIAGNOSIS — Z7901 Long term (current) use of anticoagulants: Secondary | ICD-10-CM

## 2015-11-02 DIAGNOSIS — R0609 Other forms of dyspnea: Secondary | ICD-10-CM | POA: Diagnosis not present

## 2015-11-02 DIAGNOSIS — Z880 Allergy status to penicillin: Secondary | ICD-10-CM | POA: Diagnosis not present

## 2015-11-02 DIAGNOSIS — Z87891 Personal history of nicotine dependence: Secondary | ICD-10-CM | POA: Diagnosis not present

## 2015-11-02 DIAGNOSIS — N183 Chronic kidney disease, stage 3 unspecified: Secondary | ICD-10-CM | POA: Diagnosis present

## 2015-11-02 DIAGNOSIS — I509 Heart failure, unspecified: Secondary | ICD-10-CM | POA: Diagnosis not present

## 2015-11-02 DIAGNOSIS — E875 Hyperkalemia: Secondary | ICD-10-CM | POA: Diagnosis not present

## 2015-11-02 DIAGNOSIS — Z96653 Presence of artificial knee joint, bilateral: Secondary | ICD-10-CM | POA: Diagnosis present

## 2015-11-02 DIAGNOSIS — I4891 Unspecified atrial fibrillation: Secondary | ICD-10-CM | POA: Diagnosis present

## 2015-11-02 DIAGNOSIS — E785 Hyperlipidemia, unspecified: Secondary | ICD-10-CM | POA: Diagnosis not present

## 2015-11-02 DIAGNOSIS — I10 Essential (primary) hypertension: Secondary | ICD-10-CM | POA: Diagnosis present

## 2015-11-02 DIAGNOSIS — Z7982 Long term (current) use of aspirin: Secondary | ICD-10-CM

## 2015-11-02 DIAGNOSIS — I5033 Acute on chronic diastolic (congestive) heart failure: Secondary | ICD-10-CM

## 2015-11-02 DIAGNOSIS — I38 Endocarditis, valve unspecified: Secondary | ICD-10-CM | POA: Diagnosis not present

## 2015-11-02 DIAGNOSIS — I481 Persistent atrial fibrillation: Secondary | ICD-10-CM | POA: Diagnosis not present

## 2015-11-02 DIAGNOSIS — I4892 Unspecified atrial flutter: Secondary | ICD-10-CM | POA: Diagnosis not present

## 2015-11-02 DIAGNOSIS — I130 Hypertensive heart and chronic kidney disease with heart failure and stage 1 through stage 4 chronic kidney disease, or unspecified chronic kidney disease: Principal | ICD-10-CM | POA: Diagnosis present

## 2015-11-02 DIAGNOSIS — I350 Nonrheumatic aortic (valve) stenosis: Secondary | ICD-10-CM

## 2015-11-02 DIAGNOSIS — I480 Paroxysmal atrial fibrillation: Secondary | ICD-10-CM | POA: Diagnosis not present

## 2015-11-02 DIAGNOSIS — Z66 Do not resuscitate: Secondary | ICD-10-CM | POA: Diagnosis present

## 2015-11-02 DIAGNOSIS — I5032 Chronic diastolic (congestive) heart failure: Secondary | ICD-10-CM | POA: Diagnosis present

## 2015-11-02 DIAGNOSIS — I5042 Chronic combined systolic (congestive) and diastolic (congestive) heart failure: Secondary | ICD-10-CM | POA: Diagnosis present

## 2015-11-02 HISTORY — DX: Family history of other specified conditions: Z84.89

## 2015-11-02 HISTORY — DX: Unspecified osteoarthritis, unspecified site: M19.90

## 2015-11-02 LAB — MAGNESIUM: Magnesium: 2 mg/dL (ref 1.7–2.4)

## 2015-11-02 LAB — COMPREHENSIVE METABOLIC PANEL
ALT: 26 U/L (ref 17–63)
AST: 35 U/L (ref 15–41)
Albumin: 3.7 g/dL (ref 3.5–5.0)
Alkaline Phosphatase: 59 U/L (ref 38–126)
Anion gap: 10 (ref 5–15)
BUN: 53 mg/dL — AB (ref 6–20)
CHLORIDE: 102 mmol/L (ref 101–111)
CO2: 29 mmol/L (ref 22–32)
CREATININE: 1.85 mg/dL — AB (ref 0.61–1.24)
Calcium: 9.3 mg/dL (ref 8.9–10.3)
GFR calc Af Amer: 35 mL/min — ABNORMAL LOW (ref >60)
GFR calc non Af Amer: 30 mL/min — ABNORMAL LOW (ref >60)
GLUCOSE: 122 mg/dL — AB (ref 65–99)
Potassium: 5 mmol/L (ref 3.5–5.1)
SODIUM: 141 mmol/L (ref 135–145)
Total Bilirubin: 0.9 mg/dL (ref 0.3–1.2)
Total Protein: 5.9 g/dL — ABNORMAL LOW (ref 6.5–8.1)

## 2015-11-02 LAB — CBC WITH DIFFERENTIAL/PLATELET
Basophils Absolute: 0 10*3/uL (ref 0.0–0.1)
Basophils Relative: 0 %
EOS ABS: 0.1 10*3/uL (ref 0.0–0.7)
EOS PCT: 1 %
HCT: 41.7 % (ref 39.0–52.0)
HEMOGLOBIN: 13.8 g/dL (ref 13.0–17.0)
LYMPHS ABS: 1 10*3/uL (ref 0.7–4.0)
Lymphocytes Relative: 10 %
MCH: 30.7 pg (ref 26.0–34.0)
MCHC: 33.1 g/dL (ref 30.0–36.0)
MCV: 92.7 fL (ref 78.0–100.0)
MONOS PCT: 7 %
Monocytes Absolute: 0.8 10*3/uL (ref 0.1–1.0)
Neutro Abs: 8.6 10*3/uL — ABNORMAL HIGH (ref 1.7–7.7)
Neutrophils Relative %: 82 %
Platelets: 172 10*3/uL (ref 150–400)
RBC: 4.5 MIL/uL (ref 4.22–5.81)
RDW: 14.5 % (ref 11.5–15.5)
WBC: 10.6 10*3/uL — ABNORMAL HIGH (ref 4.0–10.5)

## 2015-11-02 LAB — BRAIN NATRIURETIC PEPTIDE: B NATRIURETIC PEPTIDE 5: 397.6 pg/mL — AB (ref 0.0–100.0)

## 2015-11-02 LAB — TSH: TSH: 2.75 u[IU]/mL (ref 0.350–4.500)

## 2015-11-02 MED ORDER — VITAMIN C 500 MG PO TABS
500.0000 mg | ORAL_TABLET | Freq: Every day | ORAL | Status: DC
  Administered 2015-11-03 – 2015-11-08 (×6): 500 mg via ORAL
  Filled 2015-11-02 (×6): qty 1

## 2015-11-02 MED ORDER — BRIMONIDINE TARTRATE 0.2 % OP SOLN
1.0000 [drp] | Freq: Every day | OPHTHALMIC | Status: DC
  Administered 2015-11-03 – 2015-11-08 (×6): 1 [drp] via OPHTHALMIC
  Filled 2015-11-02: qty 5

## 2015-11-02 MED ORDER — SODIUM CHLORIDE 0.9% FLUSH: 3.0000 mL | INTRAVENOUS | Status: DC | PRN

## 2015-11-02 MED ORDER — FUROSEMIDE 10 MG/ML IJ SOLN
80.0000 mg | Freq: Two times a day (BID) | INTRAMUSCULAR | Status: DC
  Administered 2015-11-02 – 2015-11-07 (×10): 80 mg via INTRAVENOUS
  Filled 2015-11-02 (×10): qty 8

## 2015-11-02 MED ORDER — ONDANSETRON HCL 4 MG/2ML IJ SOLN: 4.0000 mg | Freq: Four times a day (QID) | INTRAMUSCULAR | Status: DC | PRN

## 2015-11-02 MED ORDER — SODIUM CHLORIDE 0.9 % IV SOLN: 250.0000 mL | INTRAVENOUS | Status: DC | PRN

## 2015-11-02 MED ORDER — FUROSEMIDE 10 MG/ML IJ SOLN: 80.0000 mg | Freq: Two times a day (BID) | INTRAMUSCULAR | Status: DC

## 2015-11-02 MED ORDER — TAMSULOSIN HCL 0.4 MG PO CAPS
0.4000 mg | ORAL_CAPSULE | Freq: Every day | ORAL | Status: DC
  Administered 2015-11-03 – 2015-11-08 (×6): 0.4 mg via ORAL
  Filled 2015-11-02 (×7): qty 1

## 2015-11-02 MED ORDER — LATANOPROST 0.005 % OP SOLN: 1.0000 [drp] | Freq: Every day | OPHTHALMIC | Status: DC

## 2015-11-02 MED ORDER — SODIUM CHLORIDE 0.9% FLUSH
3.0000 mL | Freq: Two times a day (BID) | INTRAVENOUS | Status: DC
  Administered 2015-11-02 – 2015-11-08 (×12): 3 mL via INTRAVENOUS

## 2015-11-02 MED ORDER — HEPARIN SODIUM (PORCINE) 5000 UNIT/ML IJ SOLN
5000.0000 [IU] | Freq: Three times a day (TID) | INTRAMUSCULAR | Status: DC
  Administered 2015-11-02 – 2015-11-05 (×8): 5000 [IU] via SUBCUTANEOUS
  Filled 2015-11-02 (×8): qty 1

## 2015-11-02 MED ORDER — LATANOPROST 0.005 % OP SOLN
1.0000 [drp] | Freq: Every day | OPHTHALMIC | Status: DC
  Administered 2015-11-02 – 2015-11-07 (×6): 1 [drp] via OPHTHALMIC
  Filled 2015-11-02: qty 2.5

## 2015-11-02 MED ORDER — AMLODIPINE BESYLATE 10 MG PO TABS
10.0000 mg | ORAL_TABLET | Freq: Every day | ORAL | Status: DC
  Administered 2015-11-02 – 2015-11-08 (×7): 10 mg via ORAL
  Filled 2015-11-02 (×7): qty 1

## 2015-11-02 MED ORDER — ASPIRIN EC 81 MG PO TBEC
81.0000 mg | DELAYED_RELEASE_TABLET | Freq: Every day | ORAL | Status: DC
  Administered 2015-11-03 – 2015-11-06 (×4): 81 mg via ORAL
  Filled 2015-11-02 (×6): qty 1

## 2015-11-02 MED ORDER — ACETAMINOPHEN 325 MG PO TABS: 650.0000 mg | ORAL_TABLET | ORAL | Status: DC | PRN

## 2015-11-02 NOTE — Progress Notes (Signed)
Pt admitted to 2w16. Immediately taken for CXR. Upon returning, tele applied, leg re-wrapped. No complaints of pain at this time. Pt oriented to room, call bell and phone within reach. Will continue to monitor.

## 2015-11-02 NOTE — Progress Notes (Signed)
Interval coverage note:  Patient sent from office, getting IV diuresis, Cr 1.85, baseline 1.0, possible venous congestion? BMET ordered for tomorrow.   LE 2-3+ pitting edema. Otherwise, NAD. Lung markedly decreased breath sound bilaterally.  Confirmed with patient, he wish to be DNR. He does not want intubation, shock or CPR. He is of sound mind and judgement. I have changed Code Status to DNR.  Hilbert Corrigan PA Pager: [phone]

## 2015-11-02 NOTE — Patient Instructions (Signed)
Pt is being direct admitted.  No AVS was given at this time.

## 2015-11-02 NOTE — Telephone Encounter (Signed)
Received a call from [facility], they now have a bed ready for pt.  Pts wife has been notified.  They will check in at admitting.

## 2015-11-02 NOTE — H&P (Signed)
Cardiology Office Note   Date:  11/02/2015   ID:  Wesley [name], DOB 1920-12-23, MRN [id number]  PCP:  Horatio Pel, MD  Cardiologist:  Dr. Sallyanne Kuster    Acute on chronic diastolic CHF   History of Present Illness: Wesley [name] is a 82 y.o. male with a history of HTN, HLD, and chronic diastolic CHF who presents to clinic for evaluation of 20 lbs weight gain, SOB and LE edema.    Wesley [name] has been remarkably healthy for all of his life but has a very long history of treated systemic hypertension. Roughly 3 years ago an echocardiogram was performed for hypertension and left ventricular hypertrophy. The study confirmed the presence of severe LVH( 1.6-1.9 cm) with preserved left ventricular systolic function and a moderately dilated left atrium without significant valvular abnormalities. There was G1DD. In 01/2015 repeat echocardiogram performed for dyspnea and edema that showed similar structural abnormalities but showed evidence of elevated left atrial filling pressure with a grade 2 diastolic dysfunction. Again there were no significant valvular abnormalities. Wall thickness was even more prominent at 2.1 cm. His exertional dyspnea and edema improved immediately after initiation of diuretic therapy with furosemide and the problems have not returned as long as he takes this medication  Last seen by Dr. Sallyanne Kuster in 04/2015. He felt that Wesley [name] may just have hypertensive heart disease. However, the absence of significant voltage increase on his ECG despite the presence of severe LVH by echo suggests a possible infiltrative process such as cardiac amyloidosis. However, he didn't feel that making a firm diagnosis of this disorder would have major impact on the treatment protocol in this 82 year old. He did want to rule out ischemia as a cause of his CHF and recommended a pharmacological nuclear perfusion study. Unless there was a large reversible defect, He did not think coronary angiography  would be a preferred test. He is already receiving diuretic therapy and his blood pressure is now well controlled. He had a nuclear stress test on 05/17/15 which showed no signs of severe or extensive coronary blockages. LVEF assessment was mildly lower than that obtained by echo, but felt to be a possible technical issue. No further tests planned at this time.  Today he presents to clinic for follow up. He has been seeing Dr Shelia Media his PCP for the past couple weeks for management of increased weight and fluid gain. I have no access to his labs or imaging but per daughter report he fluid on his lungs and around his heart. His lasix was increased from 40mg  QOD to daily, with not much weight loss. He has LE edema with profuse weeping. His weight is now 249 up from 229 when he last saw Dr. Sallyanne Kuster in August. Apparently his potassium was severely elevated and he was told to stop his spironolactone and Hyzaar (which were recently started by PCP since last seeing [facility]- not sure why he is on two ARBs with Hyzaar and Exforge)    Past Medical History  Diagnosis Date  . Hypertension   . Hyperlipidemia 05/07/2015  . Chronic diastolic heart failure ([place]) 05/07/2015  . Essential hypertension 05/07/2015    Past Surgical History  Procedure Laterality Date  . Lower back    . Total knee arthroplasty Bilateral      Current Outpatient Prescriptions  Medication Sig Dispense Refill  . amLODipine-valsartan (EXFORGE) 5-320 MG per tablet Take 1 tablet by mouth daily.    Marland Kitchen aspirin 81 MG  tablet Take 81 mg by mouth daily.    Marland Kitchen BRIMONIDINE TARTRATE OP Place 1 drop into both eyes every morning.    . Calcium Carbonate-Vitamin D (CALCIUM-VITAMIN D) 500-200 MG-UNIT per tablet Take 1 tablet by mouth 2 (two) times daily with a meal.    . furosemide (LASIX) 40 MG tablet Take 40 mg by mouth every other day.  2  . latanoprost (XALATAN) 0.005 % ophthalmic solution Place 1 drop into both eyes at bedtime.    . Tamsulosin HCl  (FLOMAX) 0.4 MG CAPS Take 0.4 mg by mouth daily.     . TRAVATAN Z 0.004 % SOLN ophthalmic solution Place 1 drop into both eyes at bedtime.     . vitamin C (ASCORBIC ACID) 500 MG tablet Take 500 mg by mouth daily.     No current facility-administered medications for this visit.    Allergies:   Penicillins    Social History:  The patient  reports that he has quit smoking. He does not have any smokeless tobacco history on file. He reports that he does not drink alcohol or use illicit drugs.   Family History:  The patient'sfamily history includes Diabetes in his son; Heart Problems in his mother; Hypertension in his daughter; Kidney disease in his sister; Stroke in his father.    ROS:  Please see the history of present illness.   Otherwise, review of systems are positive for NONE.   All other systems are reviewed and negative.    PHYSICAL EXAM: VS:  BP 144/68 mmHg  Pulse 58  Ht 5\' 8"  (1.727 m)  Wt 249 lb 12.8 oz (113.309 kg)  BMI 37.99 kg/m2 , BMI Body mass index is 37.99 kg/(m^2). GEN: Well nourished, well developed, in no acute distress HEENT: normal Neck: +JVD, carotid bruits, or masses Cardiac: RRR; no murmurs, rubs, or gallops, 3+ weeping pitting edema up to thighs.  Respiratory: crackles at bases bilaterally, normal work of breathing GI: soft, nontender, ++distended, + BS MS: no deformity or atrophy Skin: warm and dry, no rash Neuro:  Strength and sensation are intact Psych: euthymic mood, full affect   EKG:  EKG is not ordered today.   Recent Labs: No results found for requested labs within last 365 days.    Lipid Panel No results found for: CHOL, TRIG, HDL, CHOLHDL, VLDL, LDLCALC, LDLDIRECT    Wt Readings from Last 3 Encounters:  11/02/15 249 lb 12.8 oz (113.309 kg)  05/17/15 229 lb (103.874 kg)  05/07/15 229 lb 8 oz (104.101 kg)      Other studies Reviewed: Additional studies/ records that were reviewed today include: MYOVIEW, 2D ECHO ( see HPI) Review of  the above records demonstrates:   Lexiscan myoview (05/17/15):    The left ventricular ejection fraction is mildly decreased (45-54%).  Nuclear stress EF: 46%.  There was no ST segment deviation noted during stress.  This is a low risk study.  Low risk stress nuclear study with a small, mild, reversible inferior basal defect of borderline significance (cannot R/O very mild ischemia); otherwise normal perfusion; EF 46 but visually appears better; suggest echo to further assess; mild LVE.    ASSESSMENT AND PLAN: Wesley [name] is a 82 y.o. male with a history of HTN, HLD, and chronic diastolic CHF who presents to clinic for evaluation of 20 lbs weight gain, SOB and LE edema.    Acute on chronic diastolic CHF: he is massively volume overloaded with 3+ weeping pitting edema up to thighs, elevated  JVD and crackles on lung exam. PLAN: admit from office to hospital. Will start him on 80mg  IV lasix BID. Will get a CXR, BMET and CBC. Apparently his K and creatinine have been elevated, so we will continue to hold his ARB. If normalized we can add this back.   HTN: continue amlodipine, stopping valsartan ( in Exforge combo) due to hyperkalemia. Will increase amlodipine from 5mg  to 10mg  while off ARB ( although this may not be good long term due to LE edema). Can titrate meds while in hospital  CKD: creat elevated per PCP, will check BMET when admitted. We will have to watch carefully in the setting of IV diuresis.   Current medicines are reviewed at length with the patient today.  The patient does not have concerns regarding medicines.  The following changes have been made:  Admit for IV diuresis. Start lasix 80mg  IV BID. Will need potassium supplementation based on labs.   Labs/ tests ordered today include:  No orders of the defined types were placed in this encounter.   Disposition:   FU with Dr. Sallyanne Kuster when discharged from hospital     Signed, Crista Luria  11/02/2015  2:11 PM    [address] [place], [place], [place]  [address] Phone: [phone]; Fax: [phone]   The patient was seen, examined and discussed with Lorretta Harp, PA-C and I agree with the above.   Wesley [name] is a very pleasant, younger appearing 82 y.o. male with a history of HTN, HLD, and chronic diastolic CHF who presents to clinic for evaluation of 20 lbs weight gain, SOB and LE edema. He has severe LE edema with fluid draining through his skin. He also has crackles in his lungs. He failed home PO diuretics, we will admit for initiation of iv diuretics Lasix 80 mg iv BID. We will check potassium.  Dorothy Spark 11/02/2015

## 2015-11-03 ENCOUNTER — Observation Stay (HOSPITAL_COMMUNITY): Payer: Medicare Other

## 2015-11-03 DIAGNOSIS — I481 Persistent atrial fibrillation: Secondary | ICD-10-CM | POA: Diagnosis not present

## 2015-11-03 DIAGNOSIS — I5033 Acute on chronic diastolic (congestive) heart failure: Secondary | ICD-10-CM | POA: Diagnosis not present

## 2015-11-03 DIAGNOSIS — I5043 Acute on chronic combined systolic (congestive) and diastolic (congestive) heart failure: Secondary | ICD-10-CM | POA: Diagnosis not present

## 2015-11-03 DIAGNOSIS — N183 Chronic kidney disease, stage 3 (moderate): Secondary | ICD-10-CM | POA: Diagnosis not present

## 2015-11-03 DIAGNOSIS — I38 Endocarditis, valve unspecified: Secondary | ICD-10-CM | POA: Diagnosis not present

## 2015-11-03 LAB — BASIC METABOLIC PANEL
ANION GAP: 10 (ref 5–15)
BUN: 49 mg/dL — ABNORMAL HIGH (ref 6–20)
CO2: 28 mmol/L (ref 22–32)
CREATININE: 1.73 mg/dL — AB (ref 0.61–1.24)
Calcium: 9.1 mg/dL (ref 8.9–10.3)
Chloride: 101 mmol/L (ref 101–111)
GFR calc Af Amer: 38 mL/min — ABNORMAL LOW (ref >60)
GFR calc non Af Amer: 33 mL/min — ABNORMAL LOW (ref >60)
GLUCOSE: 104 mg/dL — AB (ref 65–99)
Potassium: 4 mmol/L (ref 3.5–5.1)
SODIUM: 139 mmol/L (ref 135–145)

## 2015-11-03 NOTE — Care Management Note (Signed)
Case Management Note  Patient Details  Name: Wesley [name] MRN: [id number] Date of Birth: 10-Apr-1921  Subjective/Objective:      Pt admitted for CHF- Pt is from home alone and has support of daughter and additional family members. Per daughter pt has DME: cane and Rolling walker. Per pt he still drives to the grocery store, usually has family drive him to MD appointments.               Action/Plan: CM to monitor for disposition needs.    Expected Discharge Date:                  Expected Discharge Plan:  Home/Self Care  In-House Referral:     Discharge planning Services  CM Consult  Post Acute Care Choice:    Choice offered to:     DME Arranged:    DME Agency:     HH Arranged:    HH Agency:     Status of Service:  In process, will continue to follow  Medicare Important Message Given:    Date Medicare IM Given:    Medicare IM give by:    Date Additional Medicare IM Given:    Additional Medicare Important Message give by:     If discussed at [place] of Stay Meetings, dates discussed:    Additional Comments:  Bethena Roys, RN 11/03/2015, 1:50 PM

## 2015-11-03 NOTE — Care Management Obs Status (Signed)
[facility] NOTIFICATION   Patient Details  Name: Wesley [name] MRN: [id number] Date of Birth: 12-Jan-1921   Medicare Observation Status Notification Given:  Yes    Bethena Roys, RN 11/03/2015, 1:50 PM

## 2015-11-03 NOTE — Progress Notes (Signed)
Primary cardiologist: Dr. Sanda Klein  Seen for followup: Acute on chronic diastolic heart failure  Subjective:    Patient lying flat in bed, states that he is already feeling better. Still has significant leg edema. No chest pain or palpitations.  Objective:   Temp:  [97.3 F (36.3 C)-98.2 F (36.8 C)] 98.2 F (36.8 C) (02/04 0546) Pulse Rate:  [57-73] 58 (02/04 0546) Resp:  [18-20] 18 (02/04 0546) BP: (141-166)/(68-86) 142/86 mmHg (02/04 0958) SpO2:  [96 %-99 %] 97 % (02/04 0546) Weight:  [200 lb 9.6 oz (90.992 kg)-249 lb 12.8 oz (113.309 kg)] 200 lb 9.6 oz (90.992 kg) (02/04 0546) Last BM Date: 11/02/15  Filed Weights   11/02/15 1558 11/03/15 0442 11/03/15 0546  Weight: 241 lb (109.317 kg) 237 lb 14.4 oz (107.911 kg) 200 lb 9.6 oz (90.992 kg)    Intake/Output Summary (Last 24 hours) at 11/03/15 1149 Last data filed at 11/03/15 1001  Gross per 24 hour  Intake    243 ml  Output   2025 ml  Net  -1782 ml    Telemetry: Rate-controlled atrial fibrillation.  Exam:  General: Elderly male, appears comfortable at rest.  Lungs: Increased breath sounds without crackles.  Cardiac: Irregular, soft apical systolic murmur.  Extremities: Significant bilateral leg edema up to thighs, weeping on the left with dressing in place.  Lab Results:  Basic Metabolic Panel:  Recent Labs Lab 11/02/15 1611 11/03/15 0302  NA 141 139  K 5.0 4.0  CL 102 101  CO2 29 28  GLUCOSE 122* 104*  BUN 53* 49*  CREATININE 1.85* 1.73*  CALCIUM 9.3 9.1  MG 2.0  --     Liver Function Tests:  Recent Labs Lab 11/02/15 1611  AST 35  ALT 26  ALKPHOS 59  BILITOT 0.9  PROT 5.9*  ALBUMIN 3.7    CBC:  Recent Labs Lab 11/02/15 1611  WBC 10.6*  HGB 13.8  HCT 41.7  MCV 92.7  PLT 172    BNP: 397  Echocardiogram 02/15/2015: Severe LVH with LVEF [id number], grade 2 diastolic dysfunction, moderate left atrial enlargement, mildly to moderately sclerotic aortic valve with very mild  aortic stenosis, mild mitral regurgitation, mild tricuspid regurgitation, PASP 33 mmHg.   Medications:   Scheduled Medications: . amLODipine  10 mg Oral Daily  . aspirin EC  81 mg Oral Daily  . brimonidine  1 drop Both Eyes Daily  . furosemide  80 mg Intravenous BID  . heparin  5,000 Units Subcutaneous 3 times per day  . latanoprost  1 drop Both Eyes QHS  . sodium chloride flush  3 mL Intravenous Q12H  . tamsulosin  0.4 mg Oral Daily  . vitamin C  500 mg Oral Daily     PRN Medications:  sodium chloride, acetaminophen, ondansetron (ZOFRAN) IV, sodium chloride flush   Assessment:   1. Acute on chronic diastolic heart failure with reported 20 pound weight gain. Good diuresis with IV Lasix so far.  2. History of very mild aortic stenosis by outside echocardiogram May 2016.  3. Essential hypertension.  4. CKD, stage 3. Creatinine 1.7.  5. Atrial fibrillation by telemetry, apparently new diagnosis. Question if related to decompensation in heart failure.   Plan/Discussion:    Continue IV Lasix. Follow-up echocardiogram is ordered. Also obtain an ECG to confirm rhythm. Heart rate is controlled. Not entirely clear what kind of candidate he is for anticoagulation, will need to be reviewed (would check with Dr. Sallyanne Kuster as he would  be managing this going forward). CHADSVASC score is a least 3.   Satira Sark, M.D., F.A.C.C.

## 2015-11-04 DIAGNOSIS — I080 Rheumatic disorders of both mitral and aortic valves: Secondary | ICD-10-CM | POA: Diagnosis present

## 2015-11-04 DIAGNOSIS — Z79899 Other long term (current) drug therapy: Secondary | ICD-10-CM | POA: Diagnosis not present

## 2015-11-04 DIAGNOSIS — Z7901 Long term (current) use of anticoagulants: Secondary | ICD-10-CM | POA: Diagnosis not present

## 2015-11-04 DIAGNOSIS — I4892 Unspecified atrial flutter: Secondary | ICD-10-CM | POA: Diagnosis present

## 2015-11-04 DIAGNOSIS — N183 Chronic kidney disease, stage 3 (moderate): Secondary | ICD-10-CM | POA: Diagnosis not present

## 2015-11-04 DIAGNOSIS — E875 Hyperkalemia: Secondary | ICD-10-CM | POA: Diagnosis present

## 2015-11-04 DIAGNOSIS — E785 Hyperlipidemia, unspecified: Secondary | ICD-10-CM | POA: Diagnosis present

## 2015-11-04 DIAGNOSIS — Z66 Do not resuscitate: Secondary | ICD-10-CM | POA: Diagnosis present

## 2015-11-04 DIAGNOSIS — I480 Paroxysmal atrial fibrillation: Secondary | ICD-10-CM | POA: Diagnosis not present

## 2015-11-04 DIAGNOSIS — I481 Persistent atrial fibrillation: Secondary | ICD-10-CM | POA: Diagnosis not present

## 2015-11-04 DIAGNOSIS — Z87891 Personal history of nicotine dependence: Secondary | ICD-10-CM | POA: Diagnosis not present

## 2015-11-04 DIAGNOSIS — I4891 Unspecified atrial fibrillation: Secondary | ICD-10-CM | POA: Diagnosis present

## 2015-11-04 DIAGNOSIS — I130 Hypertensive heart and chronic kidney disease with heart failure and stage 1 through stage 4 chronic kidney disease, or unspecified chronic kidney disease: Secondary | ICD-10-CM | POA: Diagnosis present

## 2015-11-04 DIAGNOSIS — I38 Endocarditis, valve unspecified: Secondary | ICD-10-CM | POA: Diagnosis not present

## 2015-11-04 DIAGNOSIS — Z888 Allergy status to other drugs, medicaments and biological substances status: Secondary | ICD-10-CM | POA: Diagnosis not present

## 2015-11-04 DIAGNOSIS — Z7982 Long term (current) use of aspirin: Secondary | ICD-10-CM | POA: Diagnosis not present

## 2015-11-04 DIAGNOSIS — Z96653 Presence of artificial knee joint, bilateral: Secondary | ICD-10-CM | POA: Diagnosis present

## 2015-11-04 DIAGNOSIS — Z880 Allergy status to penicillin: Secondary | ICD-10-CM | POA: Diagnosis not present

## 2015-11-04 DIAGNOSIS — I272 Other secondary pulmonary hypertension: Secondary | ICD-10-CM | POA: Diagnosis present

## 2015-11-04 DIAGNOSIS — I5033 Acute on chronic diastolic (congestive) heart failure: Secondary | ICD-10-CM | POA: Diagnosis not present

## 2015-11-04 DIAGNOSIS — I5043 Acute on chronic combined systolic (congestive) and diastolic (congestive) heart failure: Secondary | ICD-10-CM | POA: Diagnosis not present

## 2015-11-04 LAB — BASIC METABOLIC PANEL
ANION GAP: 12 (ref 5–15)
BUN: 49 mg/dL — ABNORMAL HIGH (ref 6–20)
CHLORIDE: 97 mmol/L — AB (ref 101–111)
CO2: 29 mmol/L (ref 22–32)
CREATININE: 1.59 mg/dL — AB (ref 0.61–1.24)
Calcium: 8.9 mg/dL (ref 8.9–10.3)
GFR calc non Af Amer: 36 mL/min — ABNORMAL LOW (ref >60)
GFR, EST AFRICAN AMERICAN: 42 mL/min — AB (ref >60)
Glucose, Bld: 106 mg/dL — ABNORMAL HIGH (ref 65–99)
POTASSIUM: 3.9 mmol/L (ref 3.5–5.1)
SODIUM: 138 mmol/L (ref 135–145)

## 2015-11-04 LAB — CBC
HCT: 39.5 % (ref 39.0–52.0)
HEMOGLOBIN: 13.1 g/dL (ref 13.0–17.0)
MCH: 30.8 pg (ref 26.0–34.0)
MCHC: 33.2 g/dL (ref 30.0–36.0)
MCV: 92.7 fL (ref 78.0–100.0)
PLATELETS: 156 10*3/uL (ref 150–400)
RBC: 4.26 MIL/uL (ref 4.22–5.81)
RDW: 14.8 % (ref 11.5–15.5)
WBC: 9.3 10*3/uL (ref 4.0–10.5)

## 2015-11-04 NOTE — Progress Notes (Signed)
Pt ambulated 300 ft around unit with walker on RA. Pt denies SOB; tolerated well. RN will continue to monitor.  Arnell Sieving, RN

## 2015-11-04 NOTE — Progress Notes (Signed)
Primary cardiologist: Dr. Sanda Klein  Seen for followup: Acute on chronic combined heart failure  Subjective:    Feels better, leg edema is gradually improving, left leg no longer weeping. No chest pain or palpitations. No orthopnea.  Objective:   Temp:  [97.4 F (36.3 C)-97.7 F (36.5 C)] 97.4 F (36.3 C) (02/05 0530) Pulse Rate:  [58-91] 58 (02/05 0530) Resp:  [18-19] 18 (02/04 2008) BP: (115-147)/(65-71) 128/71 mmHg (02/05 0922) SpO2:  [91 %-94 %] 94 % (02/05 0530) Weight:  [231 lb 11.2 oz (105.098 kg)] 231 lb 11.2 oz (105.098 kg) (02/05 0538) Last BM Date: 11/03/15  Filed Weights   11/03/15 0442 11/03/15 0546 11/04/15 0538  Weight: 237 lb 14.4 oz (107.911 kg) 200 lb 9.6 oz (90.992 kg) 231 lb 11.2 oz (105.098 kg)    Intake/Output Summary (Last 24 hours) at 11/04/15 1005 Last data filed at 11/04/15 0924  Gross per 24 hour  Intake    961 ml  Output   3225 ml  Net  -2264 ml    Telemetry: Rate-controlled atrial fibrillation.  Exam:  General: Elderly male, appears comfortable at rest.  Lungs: Increased breath sounds without crackles.  Cardiac: Irregular, soft apical systolic murmur.  Extremities: Improving bilateral leg edema up to thighs, left leg with dressing in place.  Lab Results:  Basic Metabolic Panel:  Recent Labs Lab 11/02/15 1611 11/03/15 0302 11/04/15 0314  NA 141 139 138  K 5.0 4.0 3.9  CL 102 101 97*  CO2 29 28 29   GLUCOSE 122* 104* 106*  BUN 53* 49* 49*  CREATININE 1.85* 1.73* 1.59*  CALCIUM 9.3 9.1 8.9  MG 2.0  --   --     Liver Function Tests:  Recent Labs Lab 11/02/15 1611  AST 35  ALT 26  ALKPHOS 59  BILITOT 0.9  PROT 5.9*  ALBUMIN 3.7    CBC:  Recent Labs Lab 11/02/15 1611 11/04/15 0314  WBC 10.6* 9.3  HGB 13.8 13.1  HCT 41.7 39.5  MCV 92.7 92.7  PLT 172 156    BNP: 397  Echocardiogram 11/03/2015: Study Conclusions  - Left ventricle: The cavity size was normal. Wall thickness was increased in  a pattern of severe LVH. Systolic function was mildly to moderately reduced. The estimated ejection fraction was in the range of 40% to 45%. Diffuse hypokinesis. - Aortic valve: Leaflets are heavily calcified and have reduced opening. The degree of AS may be underestimated by the current measurements. Valve mobility was mildly restricted. Valve area (VTI): 1.85 cm^2. Valve area (Vmax): 1.84 cm^2. Valve area (Vmean): 1.65 cm^2. - Aortic root: The aortic root was mildly dilated. - Mitral valve: There was moderate regurgitation. - Left atrium: The atrium was severely dilated. - Right atrium: The atrium was moderately dilated. - Pulmonary arteries: Systolic pressure was moderately increased. PA peak pressure: 50 mm Hg (S).   Medications:   Scheduled Medications: . amLODipine  10 mg Oral Daily  . aspirin EC  81 mg Oral Daily  . brimonidine  1 drop Both Eyes Daily  . furosemide  80 mg Intravenous BID  . heparin  5,000 Units Subcutaneous 3 times per day  . latanoprost  1 drop Both Eyes QHS  . sodium chloride flush  3 mL Intravenous Q12H  . tamsulosin  0.4 mg Oral Daily  . vitamin C  500 mg Oral Daily    PRN Medications: sodium chloride, acetaminophen, ondansetron (ZOFRAN) IV, sodium chloride flush   Assessment:   1.  Acute on chronic combined heart failure with reported 20 pound weight gain. Good diuresis with IV Lasix so far and weight is coming down. Follow-up echocardiogram shows reduction in LVEF to the range of 40-45%. Moderate mitral regurgitation noted as well as moderate pulmonary hypertension.  2. Mild aortic stenosis and moderate mitral regurgitation.  3. Essential hypertension.  4. CKD, stage 3. Creatinine 1.7 down to 1.6.  5. Atrial fibrillation/flutter, apparently new diagnosis but duration uncertain. Question if related to decompensation in heart failure. Heart rate controlled without specific therapy and he has evidence of conduction system disease by  ECG.   Plan/Discussion:    Continue IV Lasix. Heart rate is controlled with evidence of conduction system disease and atrial fibrillation/flutter of uncertain duration. Not entirely clear what kind of candidate he is for anticoagulation, will need to be reviewed (would check with Dr. Sallyanne Kuster as he would be managing this going forward). CHADSVASC score is 4. Would not add beta blocker due to slow heart rate and conduction system disease. For now continue aspirin, Norvasc, and Lasix. No ACE inhibitor or ARB with CKD.  Satira Sark, M.D., F.A.C.C.

## 2015-11-04 NOTE — Progress Notes (Signed)
11/04/2015 5:29 PM Pt walking in hall with daughter.  He is using a rolling walker.  Walked 300 feet.  Tolerated well. [place]

## 2015-11-05 DIAGNOSIS — I480 Paroxysmal atrial fibrillation: Secondary | ICD-10-CM

## 2015-11-05 LAB — CBC
HEMATOCRIT: 40.3 % (ref 39.0–52.0)
HEMOGLOBIN: 13.4 g/dL (ref 13.0–17.0)
MCH: 30.6 pg (ref 26.0–34.0)
MCHC: 33.3 g/dL (ref 30.0–36.0)
MCV: 92 fL (ref 78.0–100.0)
Platelets: 146 10*3/uL — ABNORMAL LOW (ref 150–400)
RBC: 4.38 MIL/uL (ref 4.22–5.81)
RDW: 14.5 % (ref 11.5–15.5)
WBC: 9.8 10*3/uL (ref 4.0–10.5)

## 2015-11-05 LAB — BASIC METABOLIC PANEL
ANION GAP: 12 (ref 5–15)
BUN: 43 mg/dL — ABNORMAL HIGH (ref 6–20)
CALCIUM: 8.9 mg/dL (ref 8.9–10.3)
CO2: 33 mmol/L — AB (ref 22–32)
Chloride: 95 mmol/L — ABNORMAL LOW (ref 101–111)
Creatinine, Ser: 1.57 mg/dL — ABNORMAL HIGH (ref 0.61–1.24)
GFR calc non Af Amer: 37 mL/min — ABNORMAL LOW (ref >60)
GFR, EST AFRICAN AMERICAN: 42 mL/min — AB (ref >60)
Glucose, Bld: 100 mg/dL — ABNORMAL HIGH (ref 65–99)
Potassium: 4.1 mmol/L (ref 3.5–5.1)
SODIUM: 140 mmol/L (ref 135–145)

## 2015-11-05 MED ORDER — RIVAROXABAN 15 MG PO TABS
15.0000 mg | ORAL_TABLET | Freq: Every day | ORAL | Status: DC
  Administered 2015-11-05 – 2015-11-07 (×3): 15 mg via ORAL
  Filled 2015-11-05 (×3): qty 1

## 2015-11-05 NOTE — Progress Notes (Addendum)
Xarelto education provided to patient and his daughter/son in the room. Patient is very pleasant and is willing to try Xarelto.   Family, however, is more concerned about his frequent cuts, large bruises, and extensive bleeding only on ASA 81mg , Daughter does admit that the patient does fall but falls INTO things like the wall, a table, or over a chairs causing most of bruises. He also falls in the yard from time to time. He shuffles along and does not pick up his feet.  I discussed and listened to the concerns of the daughter/son and asked them to discuss the risk/benefits again with MD on 2/7 of QOL vs stroke risk.  Lyanna Blystone S. Alford Highland, PharmD, [place] Clinical Staff Pharmacist Pager [phone]

## 2015-11-05 NOTE — Consult Note (Signed)
   [facility] CM Inpatient Consult   11/05/2015  Wesley [name] Dec 17, 1920 [id number]   Received referral from inpatient RNCM for [place] Management program. Martin Majestic to bedside to speak with patient and offer [place] Management program services. Mr. Alteri pleasantly declined. [facility] brochure and contact information left at bedside for him to call in future should he change his mind. Made inpatient RNCM aware that patient declined [place] Management services.   Marthenia Rolling, MSN-Ed, RN,BSN [facility] Liaison [phone]

## 2015-11-05 NOTE — Progress Notes (Signed)
Patient ambulated in hallway with nursing staff, patient back in bed call bell within reach. Akacia Boltz, Bettina Gavia RN

## 2015-11-05 NOTE — Discharge Instructions (Signed)

## 2015-11-05 NOTE — Progress Notes (Signed)
Patient Name: Wesley [name] Date of Encounter: 11/05/2015   SUBJECTIVE  Feeling well. No chest pain, sob or palpitations.   CURRENT MEDS . amLODipine  10 mg Oral Daily  . aspirin EC  81 mg Oral Daily  . brimonidine  1 drop Both Eyes Daily  . furosemide  80 mg Intravenous BID  . heparin  5,000 Units Subcutaneous 3 times per day  . latanoprost  1 drop Both Eyes QHS  . sodium chloride flush  3 mL Intravenous Q12H  . tamsulosin  0.4 mg Oral Daily  . vitamin C  500 mg Oral Daily    OBJECTIVE  Filed Vitals:   11/04/15 1325 11/04/15 2014 11/05/15 0315 11/05/15 0930  BP: 116/58 130/69 125/68 126/66  Pulse: 57 56 64   Temp: 97.7 F (36.5 C) 97.6 F (36.4 C) 97.8 F (36.6 C)   TempSrc: Oral Oral Oral   Resp: 19 18 18    Height:      Weight:   227 lb 8 oz (103.193 kg)   SpO2: 94% 93% 94%     Intake/Output Summary (Last 24 hours) at 11/05/15 0943 Last data filed at 11/05/15 0725  Gross per 24 hour  Intake    598 ml  Output   2525 ml  Net  -1927 ml   Filed Weights   11/03/15 0546 11/04/15 0538 11/05/15 0315  Weight: 200 lb 9.6 oz (90.992 kg) 231 lb 11.2 oz (105.098 kg) 227 lb 8 oz (103.193 kg)    PHYSICAL EXAM  General: Pleasant, NAD. Neuro: Alert and oriented X 3. Moves all extremities spontaneously. Psych: Normal affect. HEENT:  Normal  Neck: Supple without bruits or JVD. Lungs:  Resp regular and unlabored, Diminished breath sound through out.  Heart: RRR no s3, s4. Systolic murmurs. Abdomen: Soft, non-tender, non-distended, BS + x 4.  Extremities: No clubbing, cyanosis. 1+ R LE edema. L leg wrapped. DP 1+ and equal bilaterally.  Accessory Clinical Findings  CBC  Recent Labs  11/02/15 1611 11/04/15 0314 11/05/15 0509  WBC 10.6* 9.3 9.8  NEUTROABS 8.6*  --   --   HGB 13.8 13.1 13.4  HCT 41.7 39.5 40.3  MCV 92.7 92.7 92.0  PLT 172 156 [id number]*   Basic Metabolic Panel  Recent Labs  11/02/15 1611  11/04/15 0314 11/05/15 0509  NA 141  < > 138 140  K  5.0  < > 3.9 4.1  CL 102  < > 97* 95*  CO2 29  < > 29 33*  GLUCOSE 122*  < > 106* 100*  BUN 53*  < > 49* 43*  CREATININE 1.85*  < > 1.59* 1.57*  CALCIUM 9.3  < > 8.9 8.9  MG 2.0  --   --   --   < > = values in this interval not displayed. Liver Function Tests  Recent Labs  11/02/15 1611  AST 35  ALT 26  ALKPHOS 59  BILITOT 0.9  PROT 5.9*  ALBUMIN 3.7  Thyroid Function Tests  Recent Labs  11/02/15 1611  TSH 2.750    TELE  Afib/aflutte at rate of 50s  Echo 11/03/15 LV EF: 40% -  45%  ------------------------------------------------------------------- Indications:   Dyspnea 786.09.  ------------------------------------------------------------------- History:  PMH:  Congestive heart failure. Risk factors: Hypertension. Dyslipidemia.  ------------------------------------------------------------------- Study Conclusions  - Left ventricle: The cavity size was normal. Wall thickness was increased in a pattern of severe LVH. Systolic function was mildly to moderately reduced. The estimated ejection fraction was  in the range of 40% to 45%. Diffuse hypokinesis. - Aortic valve: Leaflets are heavily calcified and have reduced opening. The degree of AS may be underestimated by the current measurements. Valve mobility was mildly restricted. Valve area (VTI): 1.85 cm^2. Valve area (Vmax): 1.84 cm^2. Valve area (Vmean): 1.65 cm^2. - Aortic root: The aortic root was mildly dilated. - Mitral valve: There was moderate regurgitation. - Left atrium: The atrium was severely dilated. - Right atrium: The atrium was moderately dilated. - Pulmonary arteries: Systolic pressure was moderately increased. PA peak pressure: 50 mm Hg (S).  Radiology/Studies  Dg Chest 2 View  11/02/2015  CLINICAL DATA:  Congestive heart failure.  Hypertension EXAM: CHEST  2 VIEW COMPARISON:  10/23/2015 FINDINGS: Normal cardiac silhouette. Lungs are hyperinflated. Small bilateral  pleural effusions. Mild basilar atelectasis. Severe arthropathy shoulders. IMPRESSION: Basilar atelectasis and small effusions.  No acute findings. Electronically Signed   By: Suzy Bouchard M.D.   On: 11/02/2015 15:46    ASSESSMENT AND PLAN   1. Acute on chronic combined CHF (congestive heart failure)  With reported weight gain of 20 lb - Echocardiogram shows reduction in LVEF to the range of 40-45%. Moderate mitral regurgitation noted as well as moderate pulmonary hypertension. BNP of 397.6 - Diuresed negative 6.2 L with 14 lb weight loss. Scr improving. Continue IV diuresis with lasix 80mg  BID. Likely switch to po tomorrow. Still has LE volume overload. Lungs clear.   - needs education of daily weight management at discharge.  - No ACE inhibitor or ARB with CKD. No bb due to slow HR.   2. Atrial fibrillation/flutter - Apparently new diagnosis but duration uncertain. Question if related to decompensation in heart failure. Heart rate controlled without specific therapy and he has evidence of conduction system disease by ECG. - CHADSVASCS score of 4. Dr. Domenic Polite felt need to discusses anticoagulation with primary cardiologist Dr. Sallyanne Kuster prior to initiation.   3. Acute on chronic kidney disease, stage III - Scr improving with diuresis. Daily BMET.   4. Mild aortic stenosis and moderate mitral regurgitation. - as above  5. HTN - Stable  6. Hyperkalemia - Prior to admission. Stoped his spironolactone and Hyzaar (which were recently started by PCP since last seeing [facility]- not sure why he is on two ARBs with Hyzaar and Exforge)   Signed, Bhagat,Bhavinkumar PA-C Pager [phone]  Patient seen, examined. Available data reviewed. Agree with findings, assessment, and plan as outlined by Robbie Lis, PA-C. On exam the patient is an obese elderly male in no distress. Lung fields are clear. JVP is elevated. Heart is regular rate and rhythm. Abdomen is obese and nontender. There is 1+ edema  on the right leg and 2+ edema in the left leg. The left leg is wrapped. All data is reviewed, including the patient's most recent lab work, radiographic studies, an echocardiogram. I agree that he remains volume overloaded on exam and we should continue with IV diuresis. We discussed his diagnosis of atrial fibrillation as well as the indication for chronic oral anticoagulation. We reviewed the pros and cons of warfarin versus direct oral anticoagulant drugs. After discussion, will start him on Xarelto 15 mg daily based on his GFR. He has not had frequent falls, nor has he had clinical bleeding, hx of blood transfusion, or hx of intracranial hemorrhage.   Sherren Mocha, M.D. 11/05/2015 12:32 PM

## 2015-11-05 NOTE — Progress Notes (Signed)
Nursing note Patient ambulated in hallway with walker and family member, back in room call bell within reach. Will monitor patient. Diamantina Edinger, Bettina Gavia RN

## 2015-11-05 NOTE — Care Management Important Message (Signed)
Important Message  Patient Details  Name: COLUMBUS SUDA MRN: [id number] Date of Birth: [birth year]   Medicare Important Message Given:  Yes    Louanne Belton 11/05/2015, 12:17 [place] Message  Patient Details  Name: MADDOXX PORTNOY MRN: [id number] Date of Birth: Jun 18, 1921   Medicare Important Message Given:  Yes    Saffron Busey G 11/05/2015, 12:17 PM

## 2015-11-05 NOTE — Progress Notes (Addendum)
ANTICOAGULATION CONSULT NOTE - Initial Consult  Pharmacy Consult for Xarelto Indication: atrial fibrillation  Allergies  Allergen Reactions  . Neosporin [Neomycin-Bacitracin Zn-Polymyx] Itching and Rash  . Penicillins Itching and Rash    Has patient had a PCN reaction causing immediate rash, facial/tongue/throat swelling, SOB or lightheadedness with hypotension: Yes Has patient had a PCN reaction causing severe rash involving mucus membranes or skin necrosis: No Has patient had a PCN reaction that required hospitalization No Has patient had a PCN reaction occurring within the last 10 years: Yes If all of the above answers are "NO", then may proceed with Cephalosporin use.    Patient Measurements: Height: 5\' 8"  (172.7 cm) Weight: 227 lb 8 oz (103.193 kg) IBW/kg (Calculated) : 68.4  Vital Signs: Temp: 97.8 F (36.6 C) (02/06 0315) Temp Source: Oral (02/06 0315) BP: 126/66 mmHg (02/06 0930) Pulse Rate: 64 (02/06 0315)  Labs:  Recent Labs  11/02/15 1611 11/03/15 0302 11/04/15 0314 11/05/15 0509  HGB 13.8  --  13.1 13.4  HCT 41.7  --  39.5 40.3  PLT 172  --  156 146*  CREATININE 1.85* 1.73* 1.59* 1.57*    Estimated Creatinine Clearance: 34.9 mL/min (by C-G formula based on Cr of 1.57).   Medical History: Past Medical History  Diagnosis Date  . Hypertension   . Hyperlipidemia 05/07/2015  . Chronic diastolic heart failure ([place]) 05/07/2015  . Essential hypertension 05/07/2015  . Family history of adverse reaction to anesthesia     daughter has difficulty waking   . Arthritis     oa    Medications:  Prescriptions prior to admission  Medication Sig Dispense Refill Last Dose  . amLODipine (NORVASC) 5 MG tablet Take 5 mg by mouth daily.   11/02/2015 at Unknown time  . Artificial Tear Ointment (REFRESH P.M. OP) Place 1 application into both eyes at bedtime as needed (dry eyes).   11/01/2015 at Unknown time  . aspirin EC 81 MG tablet Take 81 mg by mouth daily.   11/02/2015 at  Unknown time  . aspirin EC 81 MG tablet Take 81 mg by mouth daily.   11/02/2015 at Unknown time  . brimonidine (ALPHAGAN P) 0.1 % SOLN Place 1 drop into both eyes daily.   11/02/2015 at Unknown time  . Carboxymethylcellulose Sodium (THERATEARS OP) Place 1 drop into both eyes 5 (five) times daily.   11/02/2015 at Unknown time  . Cholecalciferol (VITAMIN D3) 5000 units TABS Take 5,000 Units by mouth daily.   11/02/2015 at Unknown time  . furosemide (LASIX) 40 MG tablet Take 40 mg by mouth daily.   2 11/02/2015 at am  . Probiotic Product (PROBIOTIC PO) Take 1 tablet by mouth at bedtime.   11/01/2015 at Unknown time  . Tamsulosin HCl (FLOMAX) 0.4 MG CAPS Take 0.4 mg by mouth daily.    11/02/2015 at Unknown time  . Travoprost, BAK Free, (TRAVATAN) 0.004 % SOLN ophthalmic solution Place 1 drop into both eyes at bedtime.   11/01/2015 at Unknown time  . vitamin C (ASCORBIC ACID) 500 MG tablet Take 500 mg by mouth daily.   11/02/2015 at Unknown time    Assessment:  82 y/o M admitted with Acute on chronic diastolic CHF with [id number] weight gain, SOB, LE edema. K=5 and Scr 1.85. On 2 ARBs PTA per MD note?  Anticoagulation: CBC WNL. NEW afib/flutter of unknown duration. CHADSVASC 4.   Goal of Therapy:  Therapeutic oral anticoagulation Monitor platelets by anticoagulation protocol: Yes   Plan:  Xarelto 15mg  po daily adjusted for CrCl 34. S/c SQ heparin   Aliz Meritt S. Alford Highland, PharmD, BCPS Clinical Staff Pharmacist Pager [phone]  Eilene Ghazi Stillinger 11/05/2015,12:46 PM

## 2015-11-06 NOTE — Progress Notes (Signed)
Patient Profile: 82 y.o. [name] with a history of HTN, HLD, and chronic diastolic CHF who presented to clinic 11/02/15 for evaluation of 20 lbs weight gain, SOB and LE edema. He was admitted to [facility] for acute on chronic diastolic CHF. Also with new diagnosis of atrial fibrillation/flutter.    Subjective: Feels better. Breathing improved. He was able to walk 4 laps around the unit yesterday w/o DOE. He feels his LEE is improving. No chest pain.   Objective: Vital signs in last 24 hours: Temp:  [97.4 F (36.3 C)-98.2 F (36.8 C)] 97.4 F (36.3 C) (02/07 0401) Pulse Rate:  [60-88] 61 (02/07 0401) Resp:  [18] 18 (02/07 0401) BP: (115-153)/(55-62) 116/55 mmHg (02/07 0934) SpO2:  [93 %-99 %] 93 % (02/07 0401) Weight:  [224 lb 6.4 oz (101.787 kg)] 224 lb 6.4 oz (101.787 kg) (02/07 0401) Last BM Date: 11/05/15  Intake/Output from previous day: 02/06 0701 - 02/07 0700 In: 360 [P.O.:360] Out: 2725 [Urine:2725] Intake/Output this shift:    Medications Current Facility-Administered Medications  Medication Dose Route Frequency Provider Last Rate Last Dose  . 0.9 %  sodium chloride infusion  250 mL Intravenous PRN Eileen Stanford, PA-C      . acetaminophen (TYLENOL) tablet 650 mg  650 mg Oral Q4H PRN Eileen Stanford, PA-C      . amLODipine (NORVASC) tablet 10 mg  10 mg Oral Daily Eileen Stanford, PA-C   10 mg at 11/06/15 0934  . aspirin EC tablet 81 mg  81 mg Oral Daily Eileen Stanford, PA-C   81 mg at 11/06/15 [id number]  . brimonidine (ALPHAGAN) 0.2 % ophthalmic solution 1 drop  1 drop Both Eyes Daily Dorothy Spark, MD   1 drop at 11/06/15 0942  . furosemide (LASIX) injection 80 mg  80 mg Intravenous BID Eileen Stanford, PA-C   80 mg at 11/06/15 [id number]  . latanoprost (XALATAN) 0.005 % ophthalmic solution 1 drop  1 drop Both Eyes QHS Eileen Stanford, PA-C   1 drop at 11/05/15 2105  . ondansetron (ZOFRAN) injection 4 mg  4 mg Intravenous Q6H PRN Eileen Stanford, PA-C      .  Rivaroxaban (XARELTO) tablet 15 mg  15 mg Oral Q supper Karren Cobble, RPH   15 mg at 11/05/15 1711  . sodium chloride flush (NS) 0.9 % injection 3 mL  3 mL Intravenous Q12H Eileen Stanford, PA-C   3 mL at 11/05/15 2105  . sodium chloride flush (NS) 0.9 % injection 3 mL  3 mL Intravenous PRN Eileen Stanford, PA-C      . tamsulosin [facility]) capsule 0.4 mg  0.4 mg Oral Daily Eileen Stanford, PA-C   0.4 mg at 11/06/15 0934  . vitamin C (ASCORBIC ACID) tablet 500 mg  500 mg Oral Daily Eileen Stanford, PA-C   500 mg at 11/06/15 [id number]    PE: General appearance: alert, cooperative and no distress Neck: no carotid bruit and elevated JVD Lungs: slightly decreased BS at the bases, no rales Heart: irregularly irregular rhythm and regular rate Extremities: 1+ bilateral LEE L>R. the left lower extremity is wrapped (wheeping edema).  Pulses: 2+ and symmetric Skin: warm and dry Neurologic: Grossly normal  Lab Results:   Recent Labs  11/04/15 0314 11/05/15 0509  WBC 9.3 9.8  HGB 13.1 13.4  HCT 39.5 40.3  PLT 156 146*   BMET  Recent Labs  11/04/15 0314 11/05/15 0509  NA 138  140  K 3.9 4.1  CL 97* 95*  CO2 29 33*  GLUCOSE 106* 100*  BUN 49* 43*  CREATININE 1.59* 1.57*  CALCIUM 8.9 8.9      Assessment/Plan  Active Problems:   Acute on chronic diastolic CHF (congestive heart failure), NYHA class 1 (HCC)   1. Acute on Chronic Combined Systolic + Diastolic CHF:  EF [id number] on echo. Moderate mitral regurgitation noted as well as moderate pulmonary hypertension. BNP of 397.6. Good diuresis in past 24 hrs -2.7L out. I/Os net negative 8.3L. Breathing improved significantly. No longer with DOE. LEE has also improved but still with some edema L>R. Renal function, electrolytes and BP all stable. Continue lasix. Continue strict I/Os and daily weights. Low sodium diet.   2. Atrial Fibrillation/Flutter: rate is controlled. He is asymptomatic. Xarelto 15 mg daily added yesterday  for anticoagulation. CBC stable. Patient asking if he can d/c ASA. Given no known CAD, discontinuation of ASA is reasonable to reduce bleed risk, however will defer to MD.   3. Acute on Chronic Kidney Disease, Stage III: SCr improved since admission. Admit SCr was 1.85. SCr today is 1.57. K is stable. Continue to monitor.   4. Mild aortic stenosis and moderate mitral regurgitation.  5. HTN: BP stable on current regimen.    LOS: 4 days   Brittainy M. Ladoris Gene 11/06/2015 9:46 AM  Patient seen, examined. Available data reviewed. Agree with findings, assessment, and plan as outlined by Lyda Jester, PA-C. The patient is independently interviewed and examined. His edema is improving. He still has 1+ edema on the right and the left leg has a dressing in place but there appears to be at least 1+ edema there. He continues to make progress with diuresis. JVP is now normal. Lung fields are clear. Will continue IV diuretics today. I anticipate hospital discharge tomorrow.  Sherren Mocha, M.D. 11/06/2015 11:26 AM

## 2015-11-06 NOTE — Progress Notes (Signed)
Insurance check  Completed for Xarelto- will need -prior approval S/W JENNIFER @ [employer]  # [id number]   [place] # [phone]

## 2015-11-06 NOTE — Care Management Note (Signed)
Case Management Note Previous CM note initiated by Jacqlyn Krauss  Patient Details  Name: Wesley [name] MRN: [id number] Date of Birth: 14-Dec-1920  Subjective/Objective:      Pt admitted for CHF- Pt is from home alone and has support of daughter and additional family members. Per daughter pt has DME: cane and Rolling walker. Per pt he still drives to the grocery store, usually has family drive him to MD appointments.               Action/Plan: CM to monitor for disposition needs.    Expected Discharge Date:                  Expected Discharge Plan:  Home/Self Care  In-House Referral:     Discharge planning Services  CM Consult, Medication Assistance  Post Acute Care Choice:    Choice offered to:     DME Arranged:    DME Agency:     HH Arranged:    HH Agency:     Status of Service:  In process, will continue to follow  Medicare Important Message Given:  Yes Date Medicare IM Given:    Medicare IM give by:    Date Additional Medicare IM Given:    Additional Medicare Important Message give by:     If discussed at [place] of Stay Meetings, dates discussed:    Additional Comments:  11/06/15- 1630- Marvetta Gibbons RN, BSN - noted that pt started on Xarelto- benefits check completed- pt will need pre-auth- note left for MD with info to call- spoke with pt and daughter at bedside- 30 day free card given to pt- fills meds at [facility], also gets some meds from the [place]- daughter to f/u on cost for Xarelto with [facility] once pre-auth completed- states she is also going to see if [facility] will approve. Drug is a tier 3 medication- Shannon with [facility] spoke with pt and daughter [facility] nurse to f/u with pt upon return home- referral also made to [facility].  Daughter also asks for info on diet education will f/u with bedside RN regarding possible visit from dietician and possible handouts on HF and diet recommendations.   Dawayne Patricia, RN 11/06/2015, 4:46 PM

## 2015-11-07 ENCOUNTER — Encounter: Payer: Self-pay | Admitting: *Deleted

## 2015-11-07 DIAGNOSIS — N2889 Other specified disorders of kidney and ureter: Secondary | ICD-10-CM | POA: Diagnosis present

## 2015-11-07 DIAGNOSIS — I272 Pulmonary hypertension, unspecified: Secondary | ICD-10-CM | POA: Diagnosis present

## 2015-11-07 DIAGNOSIS — Z7901 Long term (current) use of anticoagulants: Secondary | ICD-10-CM

## 2015-11-07 DIAGNOSIS — N183 Chronic kidney disease, stage 3 unspecified: Secondary | ICD-10-CM | POA: Diagnosis present

## 2015-11-07 DIAGNOSIS — I350 Nonrheumatic aortic (valve) stenosis: Secondary | ICD-10-CM

## 2015-11-07 DIAGNOSIS — Z006 Encounter for examination for normal comparison and control in clinical research program: Secondary | ICD-10-CM

## 2015-11-07 DIAGNOSIS — I4892 Unspecified atrial flutter: Secondary | ICD-10-CM | POA: Diagnosis present

## 2015-11-07 LAB — BASIC METABOLIC PANEL
Anion gap: 12 (ref 5–15)
BUN: 42 mg/dL — AB (ref 6–20)
CALCIUM: 9.4 mg/dL (ref 8.9–10.3)
CO2: 38 mmol/L — ABNORMAL HIGH (ref 22–32)
CREATININE: 1.69 mg/dL — AB (ref 0.61–1.24)
Chloride: 91 mmol/L — ABNORMAL LOW (ref 101–111)
GFR calc Af Amer: 39 mL/min — ABNORMAL LOW (ref >60)
GFR, EST NON AFRICAN AMERICAN: 33 mL/min — AB (ref >60)
GLUCOSE: 112 mg/dL — AB (ref 65–99)
Potassium: 4.1 mmol/L (ref 3.5–5.1)
SODIUM: 141 mmol/L (ref 135–145)

## 2015-11-07 MED ORDER — FUROSEMIDE 40 MG PO TABS
40.0000 mg | ORAL_TABLET | Freq: Two times a day (BID) | ORAL | Status: DC
  Administered 2015-11-08: 40 mg via ORAL
  Filled 2015-11-07: qty 1

## 2015-11-07 NOTE — Progress Notes (Signed)
Patient Profile: 82 y.o. male with a history of HTN, HLD, and chronic diastolic CHF who presented to clinic 11/02/15 for evaluation of 20 lbs weight gain, SOB and LE edema. He was admitted to [facility] 11/02/15 for acute on chronic diastolic CHF and new diagnosis of atrial fibrillation/flutter.    Subjective: Feels better. Breathing improved. He was able to walk 4 laps around the unit yesterday w/o DOE. Using his roling walker. He feels his LEE is improving. No chest pain.   Objective: Vital signs in last 24 hours: Temp:  [97.7 F (36.5 C)-98.1 F (36.7 C)] 97.9 F (36.6 C) (02/08 0831) Pulse Rate:  [54-61] 56 (02/08 1014) Resp:  [16-18] 18 (02/08 1014) BP: (108-123)/(52-67) 118/67 mmHg (02/08 1014) SpO2:  [94 %-100 %] 100 % (02/08 0831) Weight:  [223 lb 3.2 oz (101.243 kg)] 223 lb 3.2 oz (101.243 kg) (02/08 0343) Last BM Date: 11/07/15  Intake/Output from previous day: 02/07 0701 - 02/08 0700 In: 720 [P.O.:720] Out: 5200 [Urine:5200] Intake/Output this shift: Total I/O In: 360 [P.O.:360] Out: -   Medications Current Facility-Administered Medications  Medication Dose Route Frequency Provider Last Rate Last Dose  . 0.9 %  sodium chloride infusion  250 mL Intravenous PRN Eileen Stanford, PA-C      . acetaminophen (TYLENOL) tablet 650 mg  650 mg Oral Q4H PRN Eileen Stanford, PA-C      . amLODipine (NORVASC) tablet 10 mg  10 mg Oral Daily Eileen Stanford, PA-C   10 mg at 11/07/15 1015  . brimonidine (ALPHAGAN) 0.2 % ophthalmic solution 1 drop  1 drop Both Eyes Daily Dorothy Spark, MD   1 drop at 11/07/15 1016  . furosemide (LASIX) injection 80 mg  80 mg Intravenous BID Eileen Stanford, PA-C   80 mg at 11/07/15 [id number]  . latanoprost (XALATAN) 0.005 % ophthalmic solution 1 drop  1 drop Both Eyes QHS Eileen Stanford, PA-C   1 drop at 11/06/15 2134  . ondansetron (ZOFRAN) injection 4 mg  4 mg Intravenous Q6H PRN Eileen Stanford, PA-C      . Rivaroxaban (XARELTO) tablet  15 mg  15 mg Oral Q supper Karren Cobble, RPH   15 mg at 11/06/15 1741  . sodium chloride flush (NS) 0.9 % injection 3 mL  3 mL Intravenous Q12H Eileen Stanford, PA-C   3 mL at 11/07/15 1016  . sodium chloride flush (NS) 0.9 % injection 3 mL  3 mL Intravenous PRN Eileen Stanford, PA-C      . tamsulosin [facility]) capsule 0.4 mg  0.4 mg Oral Daily Eileen Stanford, PA-C   0.4 mg at 11/07/15 1016  . vitamin C (ASCORBIC ACID) tablet 500 mg  500 mg Oral Daily Eileen Stanford, PA-C   500 mg at 11/07/15 1016    PE: General appearance: alert, cooperative and no distress Neck: no carotid bruit and elevated JVD Lungs: slightly decreased BS at the bases, no rales Heart: irregularly irregular rhythm and regular rate Extremities: 1+ bilateral LEE L>R. the left lower extremity is wrapped (wheeping edema).  Pulses: 2+ and symmetric Skin: warm and dry Neurologic: Grossly normal  Lab Results:   Recent Labs  11/05/15 0509  WBC 9.8  HGB 13.4  HCT 40.3  PLT 146*   BMET  Recent Labs  11/05/15 0509  NA 140  K 4.1  CL 95*  CO2 33*  GLUCOSE 100*  BUN 43*  CREATININE 1.57*  CALCIUM 8.9  Assessment/Plan  Principal Problem:   Acute on chronic combined systolic (congestive) and diastolic (congestive) heart failure (HCC) Active Problems:   New onset atrial flutter (HCC)   Chronic diastolic heart failure (HCC)   Chronic renal insufficiency, stage III (moderate)   Chronic anticoagulation-Xarelto   Essential hypertension   Hyperlipidemia   Aortic stenosis, mild   Pulmonary hypertension- PA 50 mmHg   1. Acute on Chronic Combined Systolic + Diastolic CHF:  EF [id number] on echo. Moderate mitral regurgitation noted as well as moderate pulmonary hypertension. BNP only 397.6 on adm.  Breathing improved significantly. No longer with DOE. LEE has also improved but still with some edema L>R. Renal function, electrolytes and BP all stable. I/O negative 12L and wgt down 18lbs from  adm.   2. Atrial Fibrillation/Flutter: rate is controlled. He is asymptomatic. Xarelto 15 mg daily added. CHADs VASc =4 for age, HTN, CHF.  Patient asking if he can d/c ASA. Given no known CAD, discontinuation of ASA is reasonable to reduce bleeding risk.   3. Acute on Chronic Kidney Disease, Stage III: SCr improved since admission. Admit SCr was 1.85. SCr today is 1.57. K is stable. Continue to monitor.   4. Mild aortic stenosis and moderate mitral regurgitation.     Echo 11/03/15  5. HTN: BP stable on current regimen.   Plan:   Pt's daughter a little reluctant to consider discharge today, concerned he will need to come right back. Will review with Dr Burt Knack who has been following this pt.  Stop ASA.   F/U with Dr Croitoru's APP in 1-2 weeks as a TOC f/u when he is discharged  [facility] PA-C 11/07/2015 10:48 AM  Patient seen, examined. Available data reviewed. Agree with findings, assessment, and plan as outlined by Kerin Ransom, PA-C. Exam reveals an alert elderly male in NAD, JVP mildly increased, lungs clear, heart regular, left leg is dressed with mild edema, right leg with minimal edema. Switch to oral lasix today. Check BMET today. Ask wound care Rn to evaluate left leg. Plan discharge tomorrow am.   Sherren Mocha, M.D. 11/07/2015 12:32 PM

## 2015-11-07 NOTE — Consult Note (Addendum)
WOC wound consult note Reason for Consult: Consult requested for left leg.  Pt states appearance has greatly improved; leg previously had generalized edema and large amt weeping. Wound type: Previous edema has receded, small amt remains to left foot and leg. Patchy areas of partial thickness skin loss to left anterior calf, red and moist, small amt yellow drainage, no odor Dressing procedure/placement/frequency:  ABD pads to absorb drainage, and ace wrap for light compression.  This can be removed to shower or when in bed, and their use can be discontinued after left leg is no longer weeping.  No family present to demonstrate dressing change procedure. Discussed with patient and he states his family will be able to apply after discharge and he denies further questions. Please re-consult if further assistance is needed.  Thank-you,  Julien Girt MSN, [place], [place], [place], [place]

## 2015-11-07 NOTE — Consult Note (Signed)
   [facility] CM Inpatient Consult   11/07/2015  DAKOTTA SCHOEFF 02-Aug-1921 [id number]   Spoke with inpatient RNCM who states patient's daughter was interested in more resources for patient education for CHF teaching. Therefore, Probation officer went back to re-explain and offer [facility] [employer] program services again. Daughter was not in the room however. Mr. Belaire pleasantly declined again, stating " I am already signed up for a program". He was speaking of [employer] program. Also offered EMMI calls and patient declined that as well. Made inpatient RNCM aware.  Marthenia Rolling, MSN-Ed, RN,BSN [facility] Liaison [phone]

## 2015-11-07 NOTE — Progress Notes (Signed)
REDS_0  Study Informed Consent   Subject Name: Wesley [name]  Subject met inclusion and exclusion criteria. The informed consent form, study requirements and expectations were reviewed with the subject and questions and concerns were addressed prior to the signing of the consent form. The subject verbalized understanding of the trail requirements. The subject agreed to participate in the REDS_1  trial and signed the informed consent. The informed consent was obtained prior to performance of any protocol-specific procedures for the subject. A copy of the signed informed consent was given to the subject and a copy was placed in the subject's medical record.  Jake Bathe, RN 11/07/15

## 2015-11-07 NOTE — Research (Signed)
REDS_0  Informed Consent   Subject Name: Wesley [name]  Subject met inclusion and exclusion criteria.  The informed consent form, study requirements and expectations were reviewed with the subject and questions and concerns were addressed prior to the signing of the consent form.  The subject verbalized understanding of the trail requirements.  The subject agreed to participate in the Reds_1  trial and signed the informed consent.  The informed consent was obtained prior to performance of any protocol-specific procedures for the subject.  A copy of the signed informed consent was given to the subject and a copy was placed in the subject's medical record.  [name] 11/07/2015, [phone]

## 2015-11-08 LAB — BASIC METABOLIC PANEL
Anion gap: 9 (ref 5–15)
BUN: 44 mg/dL — ABNORMAL HIGH (ref 6–20)
CO2: 35 mmol/L — ABNORMAL HIGH (ref 22–32)
Calcium: 8.7 mg/dL — ABNORMAL LOW (ref 8.9–10.3)
Chloride: 94 mmol/L — ABNORMAL LOW (ref 101–111)
Creatinine, Ser: 1.6 mg/dL — ABNORMAL HIGH (ref 0.61–1.24)
GFR calc Af Amer: 41 mL/min — ABNORMAL LOW (ref >60)
GFR calc non Af Amer: 36 mL/min — ABNORMAL LOW (ref >60)
Glucose, Bld: 108 mg/dL — ABNORMAL HIGH (ref 65–99)
Potassium: 3.6 mmol/L (ref 3.5–5.1)
Sodium: 138 mmol/L (ref 135–145)

## 2015-11-08 MED ORDER — FUROSEMIDE 40 MG PO TABS: 40.0000 mg | ORAL_TABLET | Freq: Two times a day (BID) | ORAL | Status: DC

## 2015-11-08 MED ORDER — POTASSIUM CHLORIDE ER 10 MEQ PO TBCR: 10.0000 meq | EXTENDED_RELEASE_TABLET | Freq: Every day | ORAL | Status: DC

## 2015-11-08 MED ORDER — AMLODIPINE BESYLATE 10 MG PO TABS: 10.0000 mg | ORAL_TABLET | Freq: Every day | ORAL | Status: DC

## 2015-11-08 MED ORDER — RIVAROXABAN 15 MG PO TABS: 15.0000 mg | ORAL_TABLET | Freq: Every day | ORAL | Status: DC

## 2015-11-08 NOTE — Care Management Note (Signed)
Case Management Note Previous CM note initiated by Jacqlyn Krauss  Patient Details  Name: Wesley [name] MRN: [id number] Date of Birth: 1921/06/25  Subjective/Objective:      Pt admitted for CHF- Pt is from home alone and has support of daughter and additional family members. Per daughter pt has DME: cane and Rolling walker. Per pt he still drives to the grocery store, usually has family drive him to MD appointments.               Action/Plan: CM to monitor for disposition needs.    Expected Discharge Date:     11/08/15             Expected Discharge Plan:  Home/Self Care  In-House Referral:     Discharge planning Services  CM Consult, Medication Assistance  Post Acute Care Choice:    Choice offered to:     DME Arranged:    DME Agency:     HH Arranged:    HH Agency:     Status of Service:  Completed, signed off  Medicare Important Message Given:  Yes Date Medicare IM Given:    Medicare IM give by:    Date Additional Medicare IM Given:    Additional Medicare Important Message give by:     If discussed at [place] of Stay Meetings, dates discussed:  11/08/15  Discharge Disposition: home/self care   Additional Comments:  11/06/15- 1630- Marvetta Gibbons RN, BSN - noted that pt started on Xarelto- benefits check completed- pt will need pre-auth- note left for MD with info to call- spoke with pt and daughter at bedside- 30 day free card given to pt- fills meds at [facility], also gets some meds from the [place]- daughter to f/u on cost for Xarelto with [facility] once pre-auth completed- states she is also going to see if [facility] will approve. Drug is a tier 3 medication- Shannon with [facility] spoke with pt and daughter [facility] nurse to f/u with pt upon return home- referral also made to [facility].  Daughter also asks for info on diet education will f/u with bedside RN regarding possible visit from dietician and possible handouts on HF and diet recommendations.   Dawayne Patricia, RN 11/08/2015, 1:55 PM

## 2015-11-08 NOTE — Discharge Summary (Signed)
Discharge Summary    Patient ID: Wesley [name],  MRN: [id number], DOB/AGE: [birth year] 80 y.o.  Admit date: 11/02/2015 Discharge date: 11/08/2015  Primary Care Provider: Merri Brunette DAVIDSON Primary Cardiologist: Dr. 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 Neosporin [Neomycin-Bacitracin Zn-Polymyx] Itching and Rash  . Penicillins Itching and Rash    Has patient had a PCN reaction causing immediate rash, facial/tongue/throat swelling, SOB or lightheadedness with hypotension: Yes Has patient had a PCN reaction causing severe rash involving mucus membranes or skin necrosis: No Has patient had a PCN reaction that required hospitalization No Has patient had a PCN reaction occurring within the last 10 years: Yes If all of the above answers are "NO", then may proceed with Cephalosporin use.    Diagnostic Studies/Procedures     Transthoracic Echocardiography 11/03/15  LV EF: 40% -  45%  ------------------------------------------------------------------- Indications:   Dyspnea 786.09.  ------------------------------------------------------------------- History:  PMH:  Congestive heart failure. Risk factors: Hypertension. Dyslipidemia.  ------------------------------------------------------------------- Study Conclusions  - Left ventricle: The cavity size was normal. Wall thickness was increased in a pattern of severe LVH. Systolic function was mildly to moderately  reduced. The estimated ejection fraction was in the range of 40% to 45%. Diffuse hypokinesis. - Aortic valve: Leaflets are heavily calcified and have reduced opening. The degree of AS may be underestimated by the current measurements. Valve mobility was mildly restricted. Valve area (VTI): 1.85 cm^2. Valve area (Vmax): 1.84 cm^2. Valve area (Vmean): 1.65 cm^2. - Aortic root: The aortic root was mildly dilated. - Mitral valve: There was moderate regurgitation. - Left atrium: The atrium was severely dilated. - Right atrium: The atrium was moderately dilated. - Pulmonary arteries: Systolic pressure was moderately increased. PA peak pressure: 50 mm Hg (S).   History of Present Illness     Wesley [name] is a 82 y.o. male with a history of HTN, HLD, and chronic diastolic CHF who presents to clinic for evaluation of 20 lbs weight gain, SOB and LE edema.   Wesley. [name] has been remarkably healthy for all of his life but has a very long history of treated systemic hypertension. Roughly 3 years ago an echocardiogram was performed for hypertension and left ventricular hypertrophy. The study confirmed the presence of severe LVH( 1.6-1.9 cm) with preserved left ventricular systolic function and a moderately dilated left atrium without significant valvular abnormalities. There was G1DD. In 01/2015 repeat echocardiogram performed for dyspnea and edema that showed similar structural abnormalities but showed evidence of elevated left atrial filling pressure with a grade 2 diastolic dysfunction. Again there were no significant valvular abnormalities. Wall thickness was even more prominent at 2.1 cm. His exertional dyspnea and edema improved immediately after initiation of diuretic therapy with furosemide and the problems have not returned as long as he takes this medication  Last seen by Dr. Royann Shivers in 04/2015. He felt that Wesley [name] may just have hypertensive heart disease. However, the absence of  significant voltage increase on his ECG despite the presence of severe LVH  by echo suggests a possible infiltrative process such as cardiac amyloidosis. However, he didn't feel that making a firm diagnosis of this disorder would have major impact on the treatment protocol in this 82 year old. He did want to rule out ischemia as a cause of his CHF and recommended a pharmacological nuclear perfusion study. Unless there was a large reversible defect, He did not think coronary angiography would be a preferred test. He is already receiving diuretic therapy and his blood pressure is now well controlled. He had a nuclear stress test on 05/17/15 which showed no signs of severe or extensive coronary blockages. LVEF assessment was mildly lower than that obtained by echo, but felt to be a possible technical issue. No further tests planned at this time.  He presented  to clinic for follow up 11/02/15. He has been seeing Dr Renne Crigler his PCP for the past couple weeks for management of increased weight and fluid gain. I have no access to his labs or imaging but per daughter report he fluid on his lungs and around his heart. His lasix was increased from 40mg  QOD to daily, with not much weight loss. He has LE edema with profuse weeping. His weight is now 249 up from 229 when he last saw Dr. Royann Shivers in August. Apparently his potassium was severely elevated and he was told to stop his spironolactone and Hyzaar (which were recently started by PCP since last seeing [facility]- not sure why he is on two ARBs with Hyzaar and Exforge) Which was stopped by PCP again.    Hospital Course     Consultants: WOC wound   The patient admitted directly to hospital for IV diuresis. Diuresed negative 12.3 L and weight down 19lbs (241-->222lb). Renal function (Admit SCr was 1.85. SCr is 1.6 at day of discharge), electrolytes and BP were stable. EF 40-45% on echo during admission with moderate mitral regurgitation as well as moderate pulmonary  hypertension. Breathing improved. Seen by WOC and recommendation given for dressing change for LLE wheeping edema.   The patient noted to be new onset Aflutter/fibrillation dur uncertain duration. Question if related to decompensation in heart failure. Heart rate controlled without specific therapy and he has evidence of conduction system disease by ECG. Stated Xarelto 15mg  for anticoagulation. CHADSVASCS score of 4. Discontinued ASA due to bleeding risk.   The patient has been seen by Dr. Excell Seltzer  today and deemed ready for discharge home. All follow-up appointments have been scheduled. Discharge medications are listed below.    No ACE inhibitor or ARB with CKD. No bb due to slow HR. Will need BMET during post hospital visit.   Discharge Vitals Blood pressure 118/61, pulse 59, temperature 97.5 F (36.4 C), temperature source Oral, resp. rate 20, height 5\' 8"  (1.727 m), weight 222 lb 6.4 oz (100.88 kg), SpO2 97 %.  Filed Weights   11/06/15 0401 11/07/15 0343 11/08/15 0532  Weight: 224 lb 6.4 oz (101.787 kg) 223 lb 3.2 oz (101.243 kg) 222 lb 6.4 oz (100.88 kg)    Labs & Radiologic Studies     CBC No results for input(s): WBC, NEUTROABS, HGB, HCT, MCV, PLT in the last 72 hours. Basic Metabolic Panel  Recent Labs  11/07/15 1235 11/08/15 0608  NA 141 138  K 4.1 3.6  CL 91* 94*  CO2 38* 35*  GLUCOSE 112* 108*  BUN 42* 44*  CREATININE 1.69* 1.60*  CALCIUM 9.4 8.7*  Dg Chest 2 View  11/02/2015  CLINICAL DATA:  Congestive heart  failure.  Hypertension EXAM: CHEST  2 VIEW COMPARISON:  10/23/2015 FINDINGS: Normal cardiac silhouette. Lungs are hyperinflated. Small bilateral pleural effusions. Mild basilar atelectasis. Severe arthropathy shoulders. IMPRESSION: Basilar atelectasis and small effusions.  No acute findings. Electronically Signed   By: Genevive Bi M.D.   On: 11/02/2015 15:46    Disposition   Pt is being discharged home today in good condition.  Follow-up Plans &  Appointments    Follow-up Information    Go on 11/21/2015 to follow up.   Why:  @10 :00      Discharge Instructions    Diet - low sodium heart healthy    Complete by:  As directed      Discharge instructions    Complete by:  As directed   *Weigh yourself on the same scale at same time of day and keep a log. *Report weight gain of > 2 lbs in 1 day or 5 lbs over the course of a week and/or symptoms of excess fluid (shortness of breath, difficulty lying flat, swelling, poor appetite, abdominal fullness/bloating, etc) to your doctor immediately. *Avoid foods that are high in sodium (processed, pre-packaged/canned goods, fast foods, etc). *Please attend all scheduled and reccommended follow up appointments     Increase activity slowly    Complete by:  As directed            Discharge Medications   Discharge Medication List as of 11/08/2015 12:58 PM    START taking these medications   Details  potassium chloride (K-DUR) 10 MEQ tablet Take 1 tablet (10 mEq total) by mouth daily., Starting 11/08/2015, Until Discontinued, Normal    Rivaroxaban (XARELTO) 15 MG TABS tablet Take 1 tablet (15 mg total) by mouth daily with supper., Starting 11/08/2015, Until Discontinued, Normal      CONTINUE these medications which have CHANGED   Details  amLODipine (NORVASC) 10 MG tablet Take 1 tablet (10 mg total) by mouth daily., Starting 11/08/2015, Until Discontinued, Normal    furosemide (LASIX) 40 MG tablet Take 1 tablet (40 mg total) by mouth 2 (two) times daily., Starting 11/08/2015, Until Discontinued, Normal      CONTINUE these medications which have NOT CHANGED   Details  Artificial Tear Ointment (REFRESH P.M. OP) Place 1 application into both eyes at bedtime as needed (dry eyes)., Until Discontinued, Historical Med    brimonidine (ALPHAGAN P) 0.1 % SOLN Place 1 drop into both eyes daily., Until Discontinued, Historical Med    Carboxymethylcellulose Sodium (THERATEARS OP) Place 1 drop into both eyes  5 (five) times daily., Until Discontinued, Historical Med    Cholecalciferol (VITAMIN D3) 5000 units TABS Take 5,000 Units by mouth daily., Until Discontinued, Historical Med    Probiotic Product (PROBIOTIC PO) Take 1 tablet by mouth at bedtime., Until Discontinued, Historical Med    Tamsulosin HCl (FLOMAX) 0.4 MG CAPS Take 0.4 mg by mouth daily. , Starting 07/29/2012, Until Discontinued, Historical Med    Travoprost, BAK Free, (TRAVATAN) 0.004 % SOLN ophthalmic solution Place 1 drop into both eyes at bedtime., Until Discontinued, Historical Med    vitamin C (ASCORBIC ACID) 500 MG tablet Take 500 mg by mouth daily., Until Discontinued, Historical Med      STOP taking these medications     aspirin EC 81 MG tablet      aspirin EC 81 MG tablet           Outstanding Labs/Studies   BMET during post hospital visit  Duration of Discharge Encounter  Greater than 30 minutes including physician time.  Signed, Peighton Mehra PA-C 11/08/2015, 2:51 PM

## 2015-11-08 NOTE — Progress Notes (Signed)
Pt discharge education and instructions completed with pt and daughter at bedside. Both voices understanding and denies any questions. Pt IV and telemetry removed; pt discharge home with daughter to transport him home. Pt handed his prescription for xarelto and to pick up remaining orders from preferred pharmacy on file. Pt transported off unit via wheelchair with belongings and daughter at side. Delia Heady RN

## 2015-11-08 NOTE — Care Management Important Message (Signed)
Important Message  Patient Details  Name: Wesley [name] MRN: [id number] Date of Birth: Jul 25, 1921   Medicare Important Message Given:  Yes    Loann Quill 11/08/2015, 8:09 AM

## 2015-11-08 NOTE — Progress Notes (Signed)
Patient Name: Wesley [name] Date of Encounter: 11/08/2015   SUBJECTIVE  Feeling well. No chest pain, sob or palpitations. Ambulating well. Seen by [place] and recommendation given for dressing change.   CURRENT MEDS . amLODipine  10 mg Oral Daily  . brimonidine  1 drop Both Eyes Daily  . furosemide  40 mg Oral BID  . latanoprost  1 drop Both Eyes QHS  . rivaroxaban  15 mg Oral Q supper  . sodium chloride flush  3 mL Intravenous Q12H  . tamsulosin  0.4 mg Oral Daily  . vitamin C  500 mg Oral Daily    OBJECTIVE  Filed Vitals:   11/07/15 1435 11/07/15 1501 11/07/15 2040 11/08/15 0532  BP: 162/134 106/49 107/61 118/61  Pulse: 69 51 68 59  Temp: 97.6 F (36.4 C)  98.2 F (36.8 C) 97.5 F (36.4 C)  TempSrc: Oral  Oral Oral  Resp: 17  18 20   Height:      Weight:    222 lb 6.4 oz (100.88 kg)  SpO2: 100%  95% 97%    Intake/Output Summary (Last 24 hours) at 11/08/15 1111 Last data filed at 11/08/15 0826  Gross per 24 hour  Intake    720 ml  Output    300 ml  Net    420 ml   Filed Weights   11/06/15 0401 11/07/15 0343 11/08/15 0532  Weight: 224 lb 6.4 oz (101.787 kg) 223 lb 3.2 oz (101.243 kg) 222 lb 6.4 oz (100.88 kg)    PHYSICAL EXAM  General: Pleasant, NAD. Neuro: Alert and oriented X 3. Moves all extremities spontaneously. Psych: Normal affect. HEENT:  Normal  Neck: Supple without bruits or JVD. Lungs:  Resp regular and unlabored, CTA. Heart: RRR no s3, s4, or murmurs. Abdomen: Soft, non-tender, non-distended, BS + x 4.  Extremities: 1+ bilateral LEE L>R. the left lower extremity is wrapped (wheeping edema) . DP 1+ and equal bilaterally.  Accessory Clinical Findings  CBC No results for input(s): WBC, NEUTROABS, HGB, HCT, MCV, PLT in the last 72 hours. Basic Metabolic Panel  Recent Labs  11/07/15 1235 11/08/15 0608  NA 141 138  K 4.1 3.6  CL 91* 94*  CO2 38* 35*  GLUCOSE 112* 108*  BUN 42* 44*  CREATININE 1.69* 1.60*  CALCIUM 9.4 8.7*     TELE  afib/aflutter  Radiology/Studies  Dg Chest 2 View  11/02/2015  CLINICAL DATA:  Congestive heart failure.  Hypertension EXAM: CHEST  2 VIEW COMPARISON:  10/23/2015 FINDINGS: Normal cardiac silhouette. Lungs are hyperinflated. Small bilateral pleural effusions. Mild basilar atelectasis. Severe arthropathy shoulders. IMPRESSION: Basilar atelectasis and small effusions.  No acute findings. Electronically Signed   By: Suzy Bouchard M.D.   On: 11/02/2015 15:46    ASSESSMENT AND PLAN     1. 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 Moderate mitral regurgitation noted as well as moderate pulmonary hypertension. BNP only 397.6 on adm. Breathing improved significantly. Denies DOE. LEE has also improved but still with some edema L>R. Renal function, electrolytes and BP all stable. Diuresed negative 12.3 L and wgt down 19lbs from adm.   2. Atrial Fibrillation/Flutter: rate is controlled without medications.  He is asymptomatic. Xarelto 15 mg daily added. CHADs VASc =4 for age, HTN, CHF. Discontinued ASA due to bleeding risk.   3. Acute on Chronic Kidney Disease, Stage III: SCr improved since admission. Admit SCr was 1.85. SCr today is 1.6. K is stable. Continue  to monitor.   4. Mild aortic stenosis and moderate mitral regurgitation. - Echo 11/03/15  5. HTN: BP stable on current regimen.   Dispo: plan to discharge today on current regimen. F/u with APP in 7-10 days with BMET at that time.   Jarrett Soho PA-C Pager [phone]  Patient seen, examined. Available data reviewed. Agree with findings, assessment, and plan as outlined by Robbie Lis, PA-C. Exam reveals an alert, oriented elderly male in no distress. Lung fields are clear. Heart is regular with 2/6 SEM at the RUSB. There is mild residual left leg edema. His leg is wrapped in Ace bandages morning. Appreciate wound care instructions per the wound care nursing team. I think this patient  is ready for hospital discharge. His outpatient diuretic regimen will include furosemide 40 mg twice daily. Renal function is stable with stage III CKD. Outpatient follow-up will be arranged. Sodium restriction guidelines reviewed with the patient and his daughter.   Sherren Mocha, M.D. 11/08/2015 12:00 PM

## 2015-11-08 NOTE — Research (Signed)
ReDS Vest Discharge Study  Results of ReDS reading  Your patient is in the Unblinded arm of the Vest at Discharge study.  The ReDS reading is:   ( < 39)  Your patient is ok for discharge.  The ReDS reading is:  ( >39)   Your patient will not be discharged at this time and will have a AHF team consult.  Thank You   The research team

## 2015-11-09 ENCOUNTER — Telehealth: Payer: Self-pay

## 2015-11-09 ENCOUNTER — Telehealth: Payer: Self-pay | Admitting: Cardiovascular Disease

## 2015-11-09 NOTE — Telephone Encounter (Signed)
Prior auth for Xarelto 15mg sent to [facility]. 

## 2015-11-09 NOTE — Telephone Encounter (Signed)
New message      Pt c/o medication issue:  1. Name of Medication: amlodipine  2. How are you currently taking this medication (dosage and times per day)? 10mg  3. Are you having a reaction (difficulty breathing--STAT)? no 4. What is your medication issue? Pt was in the hosp for 6 days. Prior to hospital stay, he was taking 5mg .  When he picked up his medication it was 10mg .  Did they change something?

## 2015-11-09 NOTE — Telephone Encounter (Signed)
Informed Wesley [name] that the amlodipine was increased to 10 mg at discharge from the hospital

## 2015-11-21 ENCOUNTER — Ambulatory Visit (INDEPENDENT_AMBULATORY_CARE_PROVIDER_SITE_OTHER): Payer: Medicare Other | Admitting: Cardiovascular Disease

## 2015-11-21 ENCOUNTER — Encounter: Payer: Self-pay | Admitting: Cardiovascular Disease

## 2015-11-21 VITALS — BP 134/68 | HR 64 | Ht 69.0 in | Wt 228.0 lb

## 2015-11-21 DIAGNOSIS — I5043 Acute on chronic combined systolic (congestive) and diastolic (congestive) heart failure: Secondary | ICD-10-CM | POA: Diagnosis not present

## 2015-11-21 DIAGNOSIS — N183 Chronic kidney disease, stage 3 unspecified: Secondary | ICD-10-CM

## 2015-11-21 DIAGNOSIS — I10 Essential (primary) hypertension: Secondary | ICD-10-CM

## 2015-11-21 DIAGNOSIS — N189 Chronic kidney disease, unspecified: Secondary | ICD-10-CM

## 2015-11-21 DIAGNOSIS — I481 Persistent atrial fibrillation: Secondary | ICD-10-CM

## 2015-11-21 DIAGNOSIS — Z79899 Other long term (current) drug therapy: Secondary | ICD-10-CM

## 2015-11-21 DIAGNOSIS — Z7901 Long term (current) use of anticoagulants: Secondary | ICD-10-CM

## 2015-11-21 DIAGNOSIS — I350 Nonrheumatic aortic (valve) stenosis: Secondary | ICD-10-CM | POA: Diagnosis not present

## 2015-11-21 DIAGNOSIS — I4819 Other persistent atrial fibrillation: Secondary | ICD-10-CM | POA: Insufficient documentation

## 2015-11-21 MED ORDER — FUROSEMIDE 40 MG PO TABS: ORAL_TABLET | ORAL | Status: DC

## 2015-11-21 NOTE — Progress Notes (Signed)
Patient ID: Wesley [name], male   DOB: 05-Jun-1921, 82 y.o.   MRN: [id number]    Cardiology Office Note    Date:  11/21/2015   ID:  Wesley [name], DOB 1920/12/10, MRN [id number]  PCP:  Horatio Pel, MD  Cardiologist:   Sanda Klein, MD   Chief Complaint  Patient presents with  . Follow-up    CHF-EPH    History of Present Illness:  Wesley [name] is a 82 y.o. male with chronic combined systolic and diastolic heart failure who seems to be improving with enhanced diuresis, following hospitalization 2 weeks ago. He continues to have NYHA functional class II exertional dyspnea. He still has mild lower extremity edema. Nevertheless he has lost 20 pounds to diuresis from his peak of 249 pounds. Due to problems with hyperkalemia, spironolactone has been discontinued. Creatinine on the day of discharge was 1.6.  During this hospitalization he was found to have atrial fibrillation of uncertain duration area he was unaware of palpitations. This may have led to his renewed heart failure decompensation. The heart rate was spontaneously well-controlled. He was started on Xarelto adjusted for renal function. CHADSVASCS score of 4. So far he has tolerated anticoagulation without any serious bleeding problems.  He has severe left ventricular hypertrophy by echo but no significant hyper voltage on his electrocardiogram, raising the possibility of cardiac amyloidosis. His nuclear stress test did not raise concerns for coronary insufficiency. His echocardiogram performed just 2 weeks ago showed a left ventricular ejection fraction of 40-45%, comparable to the ejection fraction estimated by nuclear scintigraphy in October 2016 at 46%. The echo again confirmed that his aortic stenosis is very mild. The left atrium is severely dilated. The Doppler parameters suggest that he is still volume overloaded (E/e'=14). The systolic PA pressure was estimated at approximately 50 mmHg.    Past Medical History    Diagnosis Date  . Hypertension   . Hyperlipidemia 05/07/2015  . Chronic diastolic heart failure ([place]) 05/07/2015  . Essential hypertension 05/07/2015  . Family history of adverse reaction to anesthesia     daughter has difficulty waking   . Arthritis     oa    Past Surgical History  Procedure Laterality Date  . Lower back    . Total knee arthroplasty Bilateral     Outpatient Prescriptions Prior to Visit  Medication Sig Dispense Refill  . amLODipine (NORVASC) 10 MG tablet Take 1 tablet (10 mg total) by mouth daily. 30 tablet 3  . Artificial Tear Ointment (REFRESH P.M. OP) Place 1 application into both eyes at bedtime as needed (dry eyes).    . brimonidine (ALPHAGAN P) 0.1 % SOLN Place 1 drop into both eyes daily.    . Carboxymethylcellulose Sodium (THERATEARS OP) Place 1 drop into both eyes 5 (five) times daily.    . Cholecalciferol (VITAMIN D3) 5000 units TABS Take 5,000 Units by mouth daily.    . potassium chloride (K-DUR) 10 MEQ tablet Take 1 tablet (10 mEq total) by mouth daily. 30 tablet 3  . Probiotic Product (PROBIOTIC PO) Take 1 tablet by mouth at bedtime.    . Rivaroxaban (XARELTO) 15 MG TABS tablet Take 1 tablet (15 mg total) by mouth daily with supper. 30 tablet 6  . Tamsulosin HCl (FLOMAX) 0.4 MG CAPS Take 0.4 mg by mouth daily.     . Travoprost, BAK Free, (TRAVATAN) 0.004 % SOLN ophthalmic solution Place 1 drop into both eyes at bedtime.    . vitamin C (ASCORBIC ACID) 500  MG tablet Take 500 mg by mouth daily.    . furosemide (LASIX) 40 MG tablet Take 1 tablet (40 mg total) by mouth 2 (two) times daily. 60 tablet 6   No facility-administered medications prior to visit.     Allergies:   Neosporin and Penicillins   Social History   Social History  . Marital Status: Widowed    Spouse Name: N/A  . Number of Children: N/A  . Years of Education: N/A   Social History Main Topics  . Smoking status: Former Research scientist (life sciences)  . Smokeless tobacco: Never Used  . Alcohol Use: No  .  Drug Use: No  . Sexual Activity: Not Asked   Other Topics Concern  . None   Social History Narrative     Family History:  The patient's family history includes Diabetes in his son; Heart Problems in his mother; Hypertension in his daughter; Kidney disease in his sister; Stroke in his father.   ROS:   Please see the history of present illness.    ROS All other systems reviewed and are negative.   PHYSICAL EXAM:   VS:  BP 134/68 mmHg  Pulse 64  Ht 5\' 9"  (1.753 m)  Wt 103.42 kg (228 lb)  BMI 33.65 kg/m2   GEN: Well nourished, well developed, in no acute distress HEENT: normal Neck: no JVD, carotid bruits, or masses Cardiac: irregular; 2/6 early peaking right upper sternal border systolic ejection murmur, no diastolic murmurs, rubs, or gallops, symmetrical 1-2 plus ankle edema  Respiratory:  clear to auscultation bilaterally, normal work of breathing GI: soft, nontender, nondistended, + BS MS: no deformity or atrophy Skin: warm and dry, no rash Neuro:  Alert and Oriented x 3, Strength and sensation are intact Psych: euthymic mood, full affect  Wt Readings from Last 3 Encounters:  11/21/15 103.42 kg (228 lb)  11/08/15 100.88 kg (222 lb 6.4 oz)  11/02/15 113.309 kg (249 lb 12.8 oz)      Studies/Labs Reviewed:   EKG:  EKG is not ordered today.    Recent Labs: 11/02/2015: ALT 26; B Natriuretic Peptide 397.6*; Magnesium 2.0; TSH 2.750 11/05/2015: Hemoglobin 13.4; Platelets 146* 11/08/2015: BUN 44*; Creatinine, Ser 1.60*; Potassium 3.6; Sodium 138   Lipid Panel No results found for: CHOL, TRIG, HDL, CHOLHDL, VLDL, LDLCALC, LDLDIRECT  Additional studies/ records that were reviewed today include:  Hospital records, echo images    ASSESSMENT:    1. Acute on chronic combined systolic (congestive) and diastolic (congestive) heart failure (HCC)   2. Persistent atrial fibrillation ([place])   3. Chronic anticoagulation-Xarelto   4. Aortic stenosis, mild   5. Essential  hypertension   6. Chronic renal insufficiency, stage III (moderate)   7. Medication management      PLAN:  In order of problems listed above:  1. CHF: He seems to have primarily diastolic dysfunction, possible restrictive/infiltrative cardiomyopathy in this elderly man with severe left atrial dilatation and relatively low voltage on ECG, possible amyloidosis. He has gained approximately 6 pounds since hospital discharge, likely all fluid. Will increase his dose of diuretic to 80 mg in the morning and 40 mg in the afternoon. Since he also has renal insufficiency and is quite elderly would like to bring him back for early reevaluation in a couple of weeks with laboratory testing. Discussed daily weights, signs and symptoms of heart failure exacerbation, sodium restriction. 2. AFib: The odds of returning and maintaining normal rhythm are extremely low in view of the severe left atrial dilatation  and his advanced age. He has spontaneously controlled ventricular rate. He is on appropriate anticoagulation, so far without bleeding complications. Continue same medications 3. Anticoagulation monitoring function periodically and consider switching to Eliquis if renal function deteriorates further 4. AS: this does not appear to be hemodynamically significant or contributing to heart failure 5. HTN: Blood pressure well controlled 6. CKD stage 3-4: Avoiding RAAS inhibitors and spironolactone due to recent hyperkalemia and rather volatile renal function  I asked his daughter to call us on Monday morning with his weight. If he has not lost 3-5 pounds by then, may need to consider adding intermittent metolazone.  Medication Adjustments/Labs and Tests Ordered: Current medicines are reviewed at length with the patient today.  Concerns regarding medicines are outlined above.  Medication changes, Labs and Tests ordered today are listed in the Patient Instructions below. Patient Instructions  Your physician has  recommended you make the following change in your medication:   INCREASE FUROSEMIDE TO 80 MG IN THE AM (2 TABLETS) AND 40 MG IN THE AFTERNOON (1 TABLET)  Your physician recommends that you return for lab work in: [place]  Dr. Sallyanne Kuster recommends that you schedule a follow-up appointment in: 4-6 WEEKS      Signed, Sanda Klein, MD  11/21/2015 [facility] [place], [place], [place]  [address] Phone: [phone]; Fax: [phone]

## 2015-11-21 NOTE — Patient Instructions (Signed)
Your physician has recommended you make the following change in your medication:   INCREASE FUROSEMIDE TO 80 MG IN THE AM (2 TABLETS) AND 40 MG IN THE AFTERNOON (1 TABLET)  Your physician recommends that you return for lab work in: [place]  Dr. Sallyanne Kuster recommends that you schedule a follow-up appointment in: 4-6 WEEKS

## 2015-11-22 ENCOUNTER — Telehealth: Payer: Self-pay | Admitting: *Deleted

## 2015-11-22 NOTE — Telephone Encounter (Signed)
Received office notes from Dr. Shelia Media. Spironolactone was stopped 10/26/15 due to abnormal labs done 10/23/15.

## 2015-11-22 NOTE — Telephone Encounter (Signed)
[employer] indicated patient was on Spironolactone in addition to his other meds.  Called patient to update med list.  Unable to reach patient or leave message.  LM at Dr. Pennie Banter office to get a copy of last office notes faxed over to review meds.

## 2015-11-26 ENCOUNTER — Telehealth: Payer: Self-pay | Admitting: Cardiovascular Disease

## 2015-11-26 NOTE — Telephone Encounter (Signed)
Returned call to patient's daughter Wells Guiles.She was calling to let Dr.Croitoru know father has lost 4 lbs since lasix increased to 80 mg am,40 mg pm.Stated left leg is better continues to have 2 places that weep.Stated he is feeling better trying to walk daily.Message sent to Dr.Croitoru.

## 2015-11-26 NOTE — Telephone Encounter (Signed)
New message      Calling to give a wt update since lasix change----pt has lost 4 lbs as of today

## 2015-11-26 NOTE — Telephone Encounter (Signed)
Returned call to patient's daughter Wells Guiles Dr.Croitoru's recommendations given.

## 2015-11-26 NOTE — Telephone Encounter (Signed)
Please continue same meds and report progress on Friday

## 2015-11-29 ENCOUNTER — Telehealth: Payer: Self-pay | Admitting: Cardiovascular Disease

## 2015-11-29 MED ORDER — RIVAROXABAN 15 MG PO TABS: 15.0000 mg | ORAL_TABLET | Freq: Every day | ORAL | Status: DC

## 2015-11-29 MED ORDER — AMLODIPINE BESYLATE 10 MG PO TABS: 10.0000 mg | ORAL_TABLET | Freq: Every day | ORAL | Status: DC

## 2015-11-29 NOTE — Telephone Encounter (Signed)
NEW MESSAGE   Pt wants rn to call him  He did not tell me reason

## 2015-11-29 NOTE — Telephone Encounter (Signed)
Pt requested that his new meds/doses prescribed be sent to Dr. Corey Harold at [facility]. Pt provided me with fax # of [phone] to send.  Printed Rx(s) for pt's amlodipine at 10mg  daily, and Xarelto at 15mg  daily were sent.

## 2015-11-30 ENCOUNTER — Telehealth: Payer: Self-pay | Admitting: Cardiovascular Disease

## 2015-11-30 NOTE — Telephone Encounter (Signed)
New message      Calling to let doctor know pt has lost 5 lbs since his 10-26-15 office visit.

## 2015-11-30 NOTE — Telephone Encounter (Signed)
Good that brings [place] close to weight at end of last hospital DC.

## 2015-11-30 NOTE — Telephone Encounter (Signed)
Calling to let doctor know pt has lost 5 lbs since his 10-26-15 office visit.

## 2015-12-05 DIAGNOSIS — Z79899 Other long term (current) drug therapy: Secondary | ICD-10-CM | POA: Diagnosis not present

## 2015-12-06 DIAGNOSIS — H401113 Primary open-angle glaucoma, right eye, severe stage: Secondary | ICD-10-CM | POA: Diagnosis not present

## 2015-12-06 DIAGNOSIS — H401192 Primary open-angle glaucoma, unspecified eye, moderate stage: Secondary | ICD-10-CM | POA: Diagnosis not present

## 2015-12-06 LAB — BASIC METABOLIC PANEL
BUN: 43 mg/dL — AB (ref 7–25)
CO2: 31 mmol/L (ref 20–31)
CREATININE: 1.85 mg/dL — AB (ref 0.70–1.11)
Calcium: 9.1 mg/dL (ref 8.6–10.3)
Chloride: 96 mmol/L — ABNORMAL LOW (ref 98–110)
Glucose, Bld: 79 mg/dL (ref 65–99)
Potassium: 4.9 mmol/L (ref 3.5–5.3)
Sodium: 137 mmol/L (ref 135–146)

## 2015-12-06 LAB — MAGNESIUM: Magnesium: 2.4 mg/dL (ref 1.5–2.5)

## 2015-12-25 ENCOUNTER — Other Ambulatory Visit: Payer: Self-pay | Admitting: Nephrology

## 2015-12-25 DIAGNOSIS — N183 Chronic kidney disease, stage 3 unspecified: Secondary | ICD-10-CM

## 2015-12-25 DIAGNOSIS — I129 Hypertensive chronic kidney disease with stage 1 through stage 4 chronic kidney disease, or unspecified chronic kidney disease: Secondary | ICD-10-CM | POA: Diagnosis not present

## 2015-12-25 DIAGNOSIS — I5040 Unspecified combined systolic (congestive) and diastolic (congestive) heart failure: Secondary | ICD-10-CM | POA: Diagnosis not present

## 2015-12-25 DIAGNOSIS — D631 Anemia in chronic kidney disease: Secondary | ICD-10-CM | POA: Diagnosis not present

## 2015-12-25 DIAGNOSIS — N2581 Secondary hyperparathyroidism of renal origin: Secondary | ICD-10-CM | POA: Diagnosis not present

## 2015-12-26 ENCOUNTER — Encounter: Payer: Self-pay | Admitting: Cardiovascular Disease

## 2015-12-26 ENCOUNTER — Ambulatory Visit (INDEPENDENT_AMBULATORY_CARE_PROVIDER_SITE_OTHER): Payer: Medicare Other | Admitting: Cardiovascular Disease

## 2015-12-26 VITALS — BP 126/70 | HR 56 | Ht 66.0 in | Wt 227.0 lb

## 2015-12-26 DIAGNOSIS — N189 Chronic kidney disease, unspecified: Secondary | ICD-10-CM

## 2015-12-26 DIAGNOSIS — Z79899 Other long term (current) drug therapy: Secondary | ICD-10-CM

## 2015-12-26 DIAGNOSIS — I272 Other secondary pulmonary hypertension: Secondary | ICD-10-CM

## 2015-12-26 DIAGNOSIS — I5043 Acute on chronic combined systolic (congestive) and diastolic (congestive) heart failure: Secondary | ICD-10-CM

## 2015-12-26 DIAGNOSIS — I481 Persistent atrial fibrillation: Secondary | ICD-10-CM | POA: Diagnosis not present

## 2015-12-26 DIAGNOSIS — Z7901 Long term (current) use of anticoagulants: Secondary | ICD-10-CM

## 2015-12-26 DIAGNOSIS — I10 Essential (primary) hypertension: Secondary | ICD-10-CM

## 2015-12-26 DIAGNOSIS — I4819 Other persistent atrial fibrillation: Secondary | ICD-10-CM

## 2015-12-26 DIAGNOSIS — N183 Chronic kidney disease, stage 3 unspecified: Secondary | ICD-10-CM

## 2015-12-26 DIAGNOSIS — I350 Nonrheumatic aortic (valve) stenosis: Secondary | ICD-10-CM

## 2015-12-26 DIAGNOSIS — E785 Hyperlipidemia, unspecified: Secondary | ICD-10-CM

## 2015-12-26 MED ORDER — FUROSEMIDE 40 MG PO TABS: 80.0000 mg | ORAL_TABLET | Freq: Two times a day (BID) | ORAL | Status: DC

## 2015-12-26 MED ORDER — METOLAZONE 5 MG PO TABS: 5.0000 mg | ORAL_TABLET | ORAL | Status: DC

## 2015-12-26 NOTE — Patient Instructions (Addendum)
Medication Instructions:  Furosemide 80mg  2 times a day   Metolazone 5mg  once a day for 5 days ---half hour prior to taking Furosemide  Hold and call if he loses greater than 10 pounds   Labwork: BMET in 1 week--dose not need to be fasting   Testing/Procedures: NONE  Follow-Up: 3-4 Weeks with Dr Sallyanne Kuster  Any Other Special Instructions Will Be Listed Below (If Applicable). CALL Friday WITH WEIGHT READING    If you need a refill on your cardiac medications before your next appointment, please call your pharmacy.

## 2015-12-26 NOTE — Progress Notes (Signed)
Patient ID: Wesley [name], male   DOB: June 22, 1921, 82 y.o.   MRN: [id number]    Cardiology Office Note    Date:  12/28/2015   ID:  Wesley [name], DOB [birth year], MRN [id number]  PCP:  Horatio Pel, MD  Cardiologist:   Sanda Klein, MD   Chief Complaint  Patient presents with  . Follow-up    no chest pain, has shortness of breath, has edema in feet and ankles,no pain or cramping in legs, lightheaded or dizziness-has when standing to fast, has fatigue    History of Present Illness:  Wesley [name] is a 82 y.o. male with recent acute on chronic combined systolic and diastolic heart failure who seems to have reached a plateau with diuresis. He continues to have NYHA functional class II exertional dyspnea. He still has lower extremity edema. He has lost 20 pounds to diuresis from his peak of 249 pounds,  but only lost one additional pound over the last month. Today he weighs 227 pounds on our office scale, 223 pounds on his home scale.  He saw Dr. Posey Pronto in the nephrology clinic yesterday. He estimates that his renal dysfunction is primarily due to nephrosclerosis and thinks that he would tolerate an increased dose of furosemide and addition of metolazone. On 3/28 creatinine of 1.83. Hemoglobin 13.4. Salt has made it clear that he would not ever want to go on hemodialysis  He has atrial fibrillation of uncertain duration and remains unaware of palpitations. The heart rate is spontaneously well-controlled. He was started on Xarelto adjusted for renal function. CHADSVASCS score of 4. So far he has tolerated anticoagulation without any serious bleeding problems.  He has severe left ventricular hypertrophy by echo but no significant hyper voltage on his electrocardiogram, raising the possibility of cardiac amyloidosis. His nuclear stress test did not raise concerns for coronary insufficiency. His echocardiogram performed just 2 weeks ago showed a left ventricular ejection fraction of 40-45%,  comparable to the ejection fraction estimated by nuclear scintigraphy in October 2016 at 46%. The echo again confirmed that his aortic stenosis is very mild. The left atrium is severely dilated. The Doppler parameters suggest that he is still volume overloaded (E/e'=14). The systolic PA pressure was estimated at approximately 50 mmHg.    Past Medical History  Diagnosis Date  . Hypertension   . Hyperlipidemia 05/07/2015  . Chronic diastolic heart failure ([place] [place]) 05/07/2015  . Essential hypertension 05/07/2015  . Family history of adverse reaction to anesthesia     daughter has difficulty waking   . Arthritis     oa    Past Surgical History  Procedure Laterality Date  . Lower back    . Total knee arthroplasty Bilateral     Outpatient Prescriptions Prior to Visit  Medication Sig Dispense Refill  . amLODipine (NORVASC) 10 MG tablet Take 1 tablet (10 mg total) by mouth daily. 90 tablet 3  . Artificial Tear Ointment (REFRESH P.M. OP) Place 1 application into both eyes at bedtime as needed (dry eyes).    . brimonidine (ALPHAGAN P) 0.1 % SOLN Place 1 drop into both eyes daily.    . Carboxymethylcellulose Sodium (THERATEARS OP) Place 1 drop into both eyes 5 (five) times daily.    . Cholecalciferol (VITAMIN D3) 5000 units TABS Take 5,000 Units by mouth daily.    . potassium chloride (K-DUR) 10 MEQ tablet Take 1 tablet (10 mEq total) by mouth daily. 30 tablet 3  . Probiotic Product (PROBIOTIC PO) Take 1 tablet by  mouth at bedtime.    . Rivaroxaban (XARELTO) 15 MG TABS tablet Take 1 tablet (15 mg total) by mouth daily with supper. 90 tablet 3  . Tamsulosin HCl (FLOMAX) 0.4 MG CAPS Take 0.4 mg by mouth daily.     . Travoprost, BAK Free, (TRAVATAN) 0.004 % SOLN ophthalmic solution Place 1 drop into both eyes at bedtime.    . vitamin C (ASCORBIC ACID) 500 MG tablet Take 500 mg by mouth daily.    . furosemide (LASIX) 40 MG tablet Take 2 tablets in the AM (80 mg) and 1 tablet in the afternoon (40 mg). 90  tablet 3   No facility-administered medications prior to visit.     Allergies:   Neosporin and Penicillins   Social History   Social History  . Marital Status: Widowed    Spouse Name: N/A  . Number of Children: N/A  . Years of Education: N/A   Social History Main Topics  . Smoking status: Former Research scientist (life sciences)  . Smokeless tobacco: Never Used  . Alcohol Use: No  . Drug Use: No  . Sexual Activity: Not Asked   Other Topics Concern  . None   Social History Narrative     Family History:  The patient's family history includes Diabetes in his son; Heart Problems in his mother; Hypertension in his daughter; Kidney disease in his sister; Stroke in his father.   ROS:   Please see the history of present illness.    ROS All other systems reviewed and are negative.   PHYSICAL EXAM:   VS:  BP 126/70 mmHg  Pulse 56  Ht 5\' 6"  (1.676 m)  Wt 102.967 kg (227 lb)  BMI 36.66 kg/m2   GEN: Well nourished, well developed, in no acute distress HEENT: normal Neck: no JVD, carotid bruits, or masses Cardiac: irregular; 2/6 early peaking right upper sternal border systolic ejection murmur, no diastolic murmurs, rubs, or gallops, symmetrical 1-2 plus ankle edema  Respiratory:  clear to auscultation bilaterally, normal work of breathing GI: soft, nontender, nondistended, + BS MS: no deformity or atrophy Skin: warm and dry, no rash Neuro:  Alert and Oriented x 3, Strength and sensation are intact Psych: euthymic mood, full affect  Wt Readings from Last 3 Encounters:  12/26/15 102.967 kg (227 lb)  11/21/15 103.42 kg (228 lb)  11/08/15 100.88 kg (222 lb 6.4 oz)      Studies/Labs Reviewed:   EKG:  EKG is  ordered today.  Atrial fibrillation 56 bpm, right bundle branch block and left anterior fascicular block, QTC 451 ms  Recent Labs: 11/02/2015: ALT 26; B Natriuretic Peptide 397.6*; TSH 2.750 11/05/2015: Hemoglobin 13.4; Platelets 146* 12/05/2015: BUN 43*; Creat 1.85*; Magnesium 2.4; Potassium  4.9; Sodium 137   Lipid Panel No results found for: CHOL, TRIG, HDL, CHOLHDL, VLDL, LDLCALC, LDLDIRECT  Additional studies/ records that were reviewed today include:  Hospital records, echo images    ASSESSMENT:    1. Acute on chronic combined systolic (congestive) and diastolic (congestive) heart failure ([place])   2. Medication management   3. Persistent atrial fibrillation ([facility])   4. Pulmonary hypertension- PA 50 mmHg   5. Aortic stenosis, mild   6. Chronic renal insufficiency, stage III (moderate)   7. Chronic anticoagulation-Xarelto   8. Hyperlipidemia   9. Essential hypertension      PLAN:  In order of problems listed above:  1. CHF: He seems to have primarily diastolic dysfunction, possible restrictive/infiltrative cardiomyopathy in this elderly man with severe left  atrial dilatation and relatively low voltage on ECG, possible amyloidosis. He still has 6 pounds over his weight at hospital discharge, likely all fluid. Will increase his dose of diuretic to 80 mg BID and add low dose metolazone. Recheck labs in 1 week. Asked him to call us once he has lost 10 pounds of weight, in case this happens before his next appointment. Since he also has renal insufficiency and is quite elderly would like to bring him back for early reevaluation in a couple of weeks with laboratory testing. Discussed daily weights, signs and symptoms of heart failure exacerbation, sodium restriction. 2. AFib: The odds of returning and maintaining normal rhythm are extremely low in view of the severe left atrial dilatation and his advanced age. He has spontaneously controlled ventricular rate. He is on appropriate anticoagulation, so far without bleeding complications. Continue same medications 3. Anticoagulation monitoring function periodically and consider switching to Eliquis if renal function deteriorates further 4. AS: this does not appear to be hemodynamically significant or contributing to heart  failure 5. HTN: Blood pressure well controlled 6. CKD stage 3-4: Avoiding RAAS inhibitors and spironolactone due to recent hyperkalemia and rather volatile renal function. Monitor renal function carefully during increased diuresis  I asked his daughter to call us on Monday morning with his weight. If he has not lost 3-5 pounds by then, may need to consider adding intermittent metolazone.  Medication Adjustments/Labs and Tests Ordered: Current medicines are reviewed at length with the patient today.  Concerns regarding medicines are outlined above.  Medication changes, Labs and Tests ordered today are listed in the Patient Instructions below. Patient Instructions  Medication Instructions:  Furosemide 80mg  2 times a day   Metolazone 5mg  once a day for 5 days ---half hour prior to taking Furosemide  Hold and call if he loses greater than 10 pounds   Labwork: BMET in 1 week--dose not need to be fasting   Testing/Procedures: NONE  Follow-Up: 3-4 Weeks with Dr Sallyanne Kuster  Any Other Special Instructions Will Be Listed Below (If Applicable). CALL Friday WITH WEIGHT READING    If you need a refill on your cardiac medications before your next appointment, please call your pharmacy.     Mikael Spray, MD  12/28/2015 12:18 PM    [facility] [address], [place], [place]  [address] Phone: [phone]; Fax: [phone]   Patient ID: DAMASCUS BLOT, male   DOB: 09-10-1921, 82 y.o.   MRN: [id number]

## 2015-12-28 ENCOUNTER — Telehealth: Payer: Self-pay | Admitting: Cardiovascular Disease

## 2015-12-28 ENCOUNTER — Ambulatory Visit
Admission: RE | Admit: 2015-12-28 | Discharge: 2015-12-28 | Disposition: A | Discharge status: Home / Self Care | Payer: Medicare Other | Source: Ambulatory Visit | Attending: Nephrology | Admitting: Nephrology

## 2015-12-28 DIAGNOSIS — N183 Chronic kidney disease, stage 3 unspecified: Secondary | ICD-10-CM

## 2015-12-28 NOTE — Telephone Encounter (Signed)
Excellent. Thanks. Please remind him to stop the metolazone if he loses a total of 10 lb from his weight on the day of his office appt. (that is, if he reaches 213 lb on his home scale).

## 2015-12-28 NOTE — Telephone Encounter (Signed)
New message      Calling to let Dr Sallyanne Kuster know that yesterday pt took all added lasix and this am he had lost 3.2lbs.

## 2015-12-28 NOTE — Telephone Encounter (Signed)
SPOKE TO REBECCA   SHE VERBALIZED UNDERSTANDING.

## 2015-12-28 NOTE — Telephone Encounter (Signed)
FORWARD TO DR Sallyanne Kuster

## 2015-12-31 ENCOUNTER — Telehealth: Payer: Self-pay | Admitting: Cardiovascular Disease

## 2015-12-31 NOTE — Telephone Encounter (Signed)
I would recommend that he take the metolazone only on days when weight is over 215 lb on his home scale.

## 2015-12-31 NOTE — Telephone Encounter (Signed)
Re: Recent meds, HF mgmt  Daughter of patient reports patient has dropped below home scale mark of 213 lbs.  He has taken last of his metolazone today. Congratulated her for patient's improvement w/ fluid load. Advised no need to refill the metolazone but to watch and notify us if he regains ~2-3 lbs.  O/w, continue lasix as recommended (80mg  BID) and f/u for BMET next week and OV on 01/30/16. Caller voiced understanding and will call again if concerns.

## 2015-12-31 NOTE — Telephone Encounter (Signed)
New Message  Pt wants to know what to do going forward- is informing office that pt has lost 10 lbs since last OV. Please call back and discuss.

## 2016-01-02 DIAGNOSIS — Z79899 Other long term (current) drug therapy: Secondary | ICD-10-CM | POA: Diagnosis not present

## 2016-01-03 ENCOUNTER — Telehealth: Payer: Self-pay

## 2016-01-03 DIAGNOSIS — Z79899 Other long term (current) drug therapy: Secondary | ICD-10-CM

## 2016-01-03 LAB — BASIC METABOLIC PANEL
BUN: 88 mg/dL — AB (ref 7–25)
CHLORIDE: 81 mmol/L — AB (ref 98–110)
CO2: 38 mmol/L — AB (ref 20–31)
Calcium: 9.6 mg/dL (ref 8.6–10.3)
Creat: 1.99 mg/dL — ABNORMAL HIGH (ref 0.70–1.11)
GLUCOSE: 91 mg/dL (ref 65–99)
POTASSIUM: 3.1 mmol/L — AB (ref 3.5–5.3)
Sodium: 133 mmol/L — ABNORMAL LOW (ref 135–146)

## 2016-01-03 MED ORDER — POTASSIUM CHLORIDE ER 10 MEQ PO TBCR: 10.0000 meq | EXTENDED_RELEASE_TABLET | Freq: Three times a day (TID) | ORAL | Status: DC

## 2016-01-03 NOTE — Telephone Encounter (Signed)
See lab result from 4/5. Refilled potassium as recommended. Ordered BMP.

## 2016-01-09 DIAGNOSIS — X32XXXD Exposure to sunlight, subsequent encounter: Secondary | ICD-10-CM | POA: Diagnosis not present

## 2016-01-09 DIAGNOSIS — Z1283 Encounter for screening for malignant neoplasm of skin: Secondary | ICD-10-CM | POA: Diagnosis not present

## 2016-01-09 DIAGNOSIS — C44629 Squamous cell carcinoma of skin of left upper limb, including shoulder: Secondary | ICD-10-CM | POA: Diagnosis not present

## 2016-01-09 DIAGNOSIS — L570 Actinic keratosis: Secondary | ICD-10-CM | POA: Diagnosis not present

## 2016-01-09 DIAGNOSIS — Z79899 Other long term (current) drug therapy: Secondary | ICD-10-CM | POA: Diagnosis not present

## 2016-01-09 DIAGNOSIS — L821 Other seborrheic keratosis: Secondary | ICD-10-CM | POA: Diagnosis not present

## 2016-01-10 LAB — BASIC METABOLIC PANEL
BUN: 86 mg/dL — AB (ref 7–25)
CHLORIDE: 86 mmol/L — AB (ref 98–110)
CO2: 37 mmol/L — ABNORMAL HIGH (ref 20–31)
Calcium: 9.2 mg/dL (ref 8.6–10.3)
Creat: 2.21 mg/dL — ABNORMAL HIGH (ref 0.70–1.11)
GLUCOSE: 93 mg/dL (ref 65–99)
POTASSIUM: 4 mmol/L (ref 3.5–5.3)
Sodium: 137 mmol/L (ref 135–146)

## 2016-01-11 ENCOUNTER — Telehealth: Payer: Self-pay

## 2016-01-11 DIAGNOSIS — Z79899 Other long term (current) drug therapy: Secondary | ICD-10-CM

## 2016-01-11 NOTE — Telephone Encounter (Signed)
-----   Message from Sanda Klein, MD sent at 01/11/2016  4:00 PM EDT ----- Let's try to back off just a bit . Take Furosemide 80 mg daily 5 days a week and take only 40 mg 2 days a week (Saturday and Tuesday). Recheck BMET in 2 weeks please

## 2016-01-11 NOTE — Telephone Encounter (Signed)
Returned call to [place] with Dr Lurline Del recommendations. Daughter verbalized understanding. Order placed for BMET in 2 weeks. Daughter agreed with plan.

## 2016-01-25 DIAGNOSIS — Z79899 Other long term (current) drug therapy: Secondary | ICD-10-CM | POA: Diagnosis not present

## 2016-01-26 LAB — BASIC METABOLIC PANEL
BUN: 50 mg/dL — AB (ref 7–25)
CHLORIDE: 97 mmol/L — AB (ref 98–110)
CO2: 24 mmol/L (ref 20–31)
CREATININE: 1.95 mg/dL — AB (ref 0.70–1.11)
Calcium: 9.2 mg/dL (ref 8.6–10.3)
GLUCOSE: 92 mg/dL (ref 65–99)
Potassium: 5.8 mmol/L — ABNORMAL HIGH (ref 3.5–5.3)
Sodium: 132 mmol/L — ABNORMAL LOW (ref 135–146)

## 2016-01-28 ENCOUNTER — Other Ambulatory Visit: Payer: Self-pay | Admitting: *Deleted

## 2016-01-28 DIAGNOSIS — Z79899 Other long term (current) drug therapy: Secondary | ICD-10-CM

## 2016-01-30 ENCOUNTER — Encounter: Payer: Self-pay | Admitting: Cardiovascular Disease

## 2016-01-30 ENCOUNTER — Ambulatory Visit (INDEPENDENT_AMBULATORY_CARE_PROVIDER_SITE_OTHER): Payer: Medicare Other | Admitting: Cardiovascular Disease

## 2016-01-30 VITALS — BP 125/69 | HR 72 | Ht 66.5 in | Wt 217.4 lb

## 2016-01-30 DIAGNOSIS — I481 Persistent atrial fibrillation: Secondary | ICD-10-CM

## 2016-01-30 DIAGNOSIS — Z7901 Long term (current) use of anticoagulants: Secondary | ICD-10-CM | POA: Diagnosis not present

## 2016-01-30 DIAGNOSIS — N189 Chronic kidney disease, unspecified: Secondary | ICD-10-CM

## 2016-01-30 DIAGNOSIS — N183 Chronic kidney disease, stage 3 unspecified: Secondary | ICD-10-CM

## 2016-01-30 DIAGNOSIS — I4891 Unspecified atrial fibrillation: Secondary | ICD-10-CM | POA: Diagnosis not present

## 2016-01-30 DIAGNOSIS — Z79899 Other long term (current) drug therapy: Secondary | ICD-10-CM | POA: Diagnosis not present

## 2016-01-30 DIAGNOSIS — I5043 Acute on chronic combined systolic (congestive) and diastolic (congestive) heart failure: Secondary | ICD-10-CM

## 2016-01-30 DIAGNOSIS — I4819 Other persistent atrial fibrillation: Secondary | ICD-10-CM

## 2016-01-30 LAB — TSH: TSH: 3.04 m[IU]/L (ref 0.40–4.50)

## 2016-01-30 MED ORDER — METOLAZONE 5 MG PO TABS: 5.0000 mg | ORAL_TABLET | ORAL | Status: DC

## 2016-01-30 NOTE — Patient Instructions (Addendum)
Medication Instructions:  Take 1 tab of Metolazone (5mg ) by mouth when weight is over 205lbs; when taking Metolazone take 20mg  of Potassium ( 2 of the 10mg  tabs) Labwork: BMET and TSH   Not fasting  Testing/Procedures: None  Follow-Up: 1 MONTH  Any Other Special Instructions Will Be Listed Below (If Applicable).     If you need a refill on your cardiac medications before your next appointment, please call your pharmacy.

## 2016-01-31 ENCOUNTER — Telehealth: Payer: Self-pay

## 2016-01-31 DIAGNOSIS — I5043 Acute on chronic combined systolic (congestive) and diastolic (congestive) heart failure: Secondary | ICD-10-CM

## 2016-01-31 DIAGNOSIS — Z79899 Other long term (current) drug therapy: Secondary | ICD-10-CM

## 2016-01-31 LAB — BASIC METABOLIC PANEL
BUN: 46 mg/dL — ABNORMAL HIGH (ref 7–25)
CALCIUM: 9.2 mg/dL (ref 8.6–10.3)
CO2: 27 mmol/L (ref 20–31)
CREATININE: 1.81 mg/dL — AB (ref 0.70–1.11)
Chloride: 95 mmol/L — ABNORMAL LOW (ref 98–110)
Glucose, Bld: 83 mg/dL (ref 65–99)
Potassium: 4.6 mmol/L (ref 3.5–5.3)
SODIUM: 132 mmol/L — AB (ref 135–146)

## 2016-01-31 NOTE — Telephone Encounter (Signed)
-----   Message from Sanda Klein, MD sent at 01/31/2016  8:10 AM EDT ----- Labs look pretty good including renal function (moderately abnormal, but at baseline) and potassium low

## 2016-01-31 NOTE — Progress Notes (Signed)
Patient ID: Wesley [name], male   DOB: July 18, 1921, 82 y.o.   MRN: [id number] Patient ID: Wesley [name], male   DOB: [birth year], 82 y.o.   MRN: [id number]    Cardiology Office Note    Date:  01/31/2016   ID:  Wesley [name], DOB [birth year], MRN [id number]  PCP:  Horatio Pel, MD  Cardiologist:   Sanda Klein, MD   Chief Complaint  Patient presents with  . Follow-up    no Chest pain, yes shortness of breath everyday, yes edema, no pain or cramping in legs, no lightheaded or dizziness    History of Present Illness:  Wesley [name] is a 82 y.o. male withchronic combined systolic and diastolic heart failure Who has had some recent adjustments in his diuretics and has recurrent edema. His breathing remains pretty good, NYHA class II. His weight today is 217 pounds is 32 pounds less than it was in early February, but he has gained a few pounds recently. It seems he felt his best when his weight was around 205 pounds. His renal function seems to have stabilized around a creatinine of about 1.8-1.9.  His current diuretic regimen consists of furosemide 80 mg only once daily, although some documentation has been confusing. Dose is confirmed by his family.  Late last week his labs showed an elevated potassium which was a surprising finding. I asked him to stop his potassium supplement over the weekend, but he misunderstood and simply decrease the dose to 1 tablet of potassium chloride 10 mEq daily. Repeat labs checked today showed that his potassium level is actually normal.  He has atrial fibrillation of uncertain duration, probably permanent, and remains unaware of palpitations. The heart rate is spontaneously well-controlled. He takes Xarelto adjusted for renal function. CHADSVASCS score of 4. So far he has tolerated anticoagulation without any serious bleeding problems.  He has severe left ventricular hypertrophy by echo but no significant hyper voltage on his electrocardiogram, raising the  possibility of cardiac amyloidosis. His nuclear stress test did not raise concerns for coronary insufficiency. His echocardiogram performed just 2 weeks ago showed a left ventricular ejection fraction of 40-45%, comparable to the ejection fraction estimated by nuclear scintigraphy in October 2016 at 46%. The echo again confirmed that his aortic stenosis is very mild. The left atrium is severely dilated. The Doppler parameters suggest that he is still volume overloaded (E/e'=14). The systolic PA pressure was estimated at approximately 50 mmHg.    Past Medical History  Diagnosis Date  . Hypertension   . Hyperlipidemia 05/07/2015  . Chronic diastolic heart failure ([place]) 05/07/2015  . Essential hypertension 05/07/2015  . Family history of adverse reaction to anesthesia     daughter has difficulty waking   . Arthritis     oa    Past Surgical History  Procedure Laterality Date  . Lower back    . Total knee arthroplasty Bilateral     Outpatient Prescriptions Prior to Visit  Medication Sig Dispense Refill  . amLODipine (NORVASC) 10 MG tablet Take 1 tablet (10 mg total) by mouth daily. 90 tablet 3  . Artificial Tear Ointment (REFRESH P.M. OP) Place 1 application into both eyes at bedtime as needed (dry eyes).    . brimonidine (ALPHAGAN P) 0.1 % SOLN Place 1 drop into both eyes daily.    . Carboxymethylcellulose Sodium (THERATEARS OP) Place 1 drop into both eyes 5 (five) times daily.    . Cholecalciferol (VITAMIN D3) 5000 units TABS Take 5,000 Units by  mouth daily.    Marland Kitchen KLOR-CON M10 10 MEQ tablet Take 1 tablet by mouth daily. Take 1 tab by mouth daily  3  . latanoprost (XALATAN) 0.005 % ophthalmic solution Place 1 drop into both eyes 2 (two) times daily. Place 1 drop into both eyes 2 (two) times daily.    . Probiotic Product (PROBIOTIC PO) Take 1 tablet by mouth at bedtime.    . Rivaroxaban (XARELTO) 15 MG TABS tablet Take 1 tablet (15 mg total) by mouth daily with supper. 90 tablet 3  . Tamsulosin  HCl (FLOMAX) 0.4 MG CAPS Take 0.4 mg by mouth daily.     . Travoprost, BAK Free, (TRAVATAN) 0.004 % SOLN ophthalmic solution Place 1 drop into both eyes at bedtime.    . vitamin C (ASCORBIC ACID) 500 MG tablet Take 500 mg by mouth daily.    . furosemide (LASIX) 40 MG tablet Take 2 tablets (80 mg total) by mouth 2 (two) times daily. (Patient not taking: Reported on 01/30/2016) 90 tablet 3  . metolazone (ZAROXOLYN) 5 MG tablet Take 1 tablet (5 mg total) by mouth as directed. (Patient not taking: Reported on 01/30/2016) 30 tablet 11  . potassium chloride (K-DUR) 10 MEQ tablet Take 1 tablet (10 mEq total) by mouth 3 (three) times daily. (Patient not taking: Reported on 01/30/2016) 90 tablet 5   No facility-administered medications prior to visit.     Allergies:   Neosporin and Penicillins   Social History   Social History  . Marital Status: Widowed    Spouse Name: N/A  . Number of Children: N/A  . Years of Education: N/A   Social History Main Topics  . Smoking status: Former Research scientist (life sciences)  . Smokeless tobacco: Never Used  . Alcohol Use: No  . Drug Use: No  . Sexual Activity: Not Asked   Other Topics Concern  . None   Social History Narrative     Family History:  The patient's family history includes Diabetes in his son; Heart Problems in his mother; Hypertension in his daughter; Kidney disease in his sister; Stroke in his father.   ROS:   Please see the history of present illness.    ROS All other systems reviewed and are negative.   PHYSICAL EXAM:   VS:  BP 125/69 mmHg  Pulse 72  Ht 5' 6.5" (1.689 m)  Wt 98.612 kg (217 lb 6.4 oz)  BMI 34.57 kg/m2   GEN: Well nourished, well developed, in no acute distress HEENT: normal Neck: no JVD, carotid bruits, or masses Cardiac: irregular; 2/6 early peaking right upper sternal border systolic ejection murmur, no diastolic murmurs, rubs, or gallops, symmetrical 2+ symmetrical pitting calf and ankle edema  Respiratory:  clear to auscultation  bilaterally, normal work of breathing GI: soft, nontender, nondistended, + BS MS: no deformity or atrophy Skin: warm and dry, no rash Neuro:  Alert and Oriented x 3, Strength and sensation are intact Psych: euthymic mood, full affect  Wt Readings from Last 3 Encounters:  01/30/16 98.612 kg (217 lb 6.4 oz)  12/26/15 102.967 kg (227 lb)  11/21/15 103.42 kg (228 lb)      Studies/Labs Reviewed:   EKG:  EKG is  ordered today.  Atrial fibrillation 56 bpm, right bundle branch block and left anterior fascicular block, QTC 451 ms  Recent Labs: 11/02/2015: ALT 26; B Natriuretic Peptide 397.6* 11/05/2015: Hemoglobin 13.4; Platelets 146* 12/05/2015: Magnesium 2.4 01/30/2016: BUN 46*; Creat 1.81*; Potassium 4.6; Sodium 132*; TSH 3.04  ASSESSMENT:    1. Persistent atrial fibrillation ([place])   2. Medication management   3. Acute on chronic combined systolic (congestive) and diastolic (congestive) heart failure ([place])   4. Chronic anticoagulation-Xarelto   5. Chronic renal insufficiency, stage III (moderate)      PLAN:  In order of problems listed above:  1. CHF: He seems to have primarily diastolic dysfunction, possible restrictive/infiltrative cardiomyopathy in this elderly man with severe left atrial dilatation and relatively low voltage on ECG, possible amyloidosis. He Appears to still have mild hypervolemia. Will keep his furosemide at 80 mg and metolazone on an as-needed basis, to attempt to keep his weight under 205 pounds (as measured by his home scale). Rechecked labs today. Since he also has renal insufficiency and is quite elderly would like to bring him back for early reevaluation in about 4 weeks with laboratory testing. Reviewed daily weights, signs and symptoms of heart failure exacerbation, sodium restriction. 2. AFib: The odds of returning and maintaining normal rhythm are extremely low in view of the severe left atrial dilatation and his advanced age. He has spontaneously  controlled ventricular rate. He is on appropriate anticoagulation, so far without bleeding complications. Continue same medications 3. Anticoagulation monitoring function periodically and consider switching to Eliquis if renal function deteriorates further 4. AS: this does not appear to be hemodynamically significant or contributing to heart failure 5. HTN: Blood pressure well controlled 6. CKD stage 3-4: Avoiding RAAS inhibitors and spironolactone due to recent hyperkalemia and rather volatile renal function. Monitor renal function carefully during increased diuresis  Medication Adjustments/Labs and Tests Ordered: Current medicines are reviewed at length with the patient today.  Concerns regarding medicines are outlined above.  Medication changes, Labs and Tests ordered today are listed in the Patient Instructions below. Patient Instructions  Medication Instructions:  Take 1 tab of Metolazone (5mg ) by mouth when weight is over 205lbs; when taking Metolazone take 20mg  of Potassium ( 2 of the 10mg  tabs) Labwork: BMET and TSH   Not fasting  Testing/Procedures: None  Follow-Up: 1 MONTH  Any Other Special Instructions Will Be Listed Below (If Applicable).     If you need a refill on your cardiac medications before your next appointment, please call your pharmacy.     Mikael Spray, MD  01/31/2016 8:10 AM    [facility] [address], [place], [place]  [address] Phone: [phone]; Fax: [phone]   Patient ID: Wesley [name], male   DOB: [birth year], 82 y.o.   MRN: [id number]

## 2016-01-31 NOTE — Telephone Encounter (Signed)
Called patient's daughter, Wells Guiles ([place]).  Advised, per Dr Lurline Del instruction, daughter to have patient take potassium 20 mEq only on days he has to take metolazone, otherwise, he is to continue potassium 10 mEq. Repeat BMP in 2 weeks Daughter verbalized understanding and agreed with plan.

## 2016-02-04 ENCOUNTER — Telehealth: Payer: Self-pay | Admitting: *Deleted

## 2016-02-04 NOTE — Telephone Encounter (Signed)
I spoke with patient on 01/31/16 for 62-month phone call for [email or web address]  Study. Patient has not been re-hospitalized in last 3 months. I thanked patient for his participation in the study.

## 2016-02-14 DIAGNOSIS — I5043 Acute on chronic combined systolic (congestive) and diastolic (congestive) heart failure: Secondary | ICD-10-CM | POA: Diagnosis not present

## 2016-02-14 DIAGNOSIS — Z79899 Other long term (current) drug therapy: Secondary | ICD-10-CM | POA: Diagnosis not present

## 2016-02-14 DIAGNOSIS — I509 Heart failure, unspecified: Secondary | ICD-10-CM | POA: Diagnosis not present

## 2016-02-15 LAB — BASIC METABOLIC PANEL
BUN: 65 mg/dL — AB (ref 7–25)
CHLORIDE: 84 mmol/L — AB (ref 98–110)
CO2: 38 mmol/L — AB (ref 20–31)
CREATININE: 1.74 mg/dL — AB (ref 0.70–1.11)
Calcium: 9.4 mg/dL (ref 8.6–10.3)
Glucose, Bld: 111 mg/dL — ABNORMAL HIGH (ref 65–99)
Potassium: 3.5 mmol/L (ref 3.5–5.3)
Sodium: 133 mmol/L — ABNORMAL LOW (ref 135–146)

## 2016-02-18 DIAGNOSIS — N183 Chronic kidney disease, stage 3 (moderate): Secondary | ICD-10-CM | POA: Diagnosis not present

## 2016-02-19 ENCOUNTER — Telehealth: Payer: Self-pay

## 2016-02-19 DIAGNOSIS — Z79899 Other long term (current) drug therapy: Secondary | ICD-10-CM

## 2016-02-19 NOTE — Telephone Encounter (Signed)
Notes Recorded by Dionne Bucy Taneah Masri, CMA on 02/19/2016 at 3:40 PM Called patient's daughter, Wesley [name] [facility]) with results. Patient verbalized understanding.  Labs ordered and will be mailed to patient.

## 2016-02-19 NOTE — Telephone Encounter (Signed)
-----   Message from Sanda Klein, MD sent at 02/15/2016  9:18 AM EDT ----- Labs are OK. Recheck in one month please

## 2016-02-26 DIAGNOSIS — N2581 Secondary hyperparathyroidism of renal origin: Secondary | ICD-10-CM | POA: Diagnosis not present

## 2016-02-26 DIAGNOSIS — I5040 Unspecified combined systolic (congestive) and diastolic (congestive) heart failure: Secondary | ICD-10-CM | POA: Diagnosis not present

## 2016-02-26 DIAGNOSIS — N183 Chronic kidney disease, stage 3 (moderate): Secondary | ICD-10-CM | POA: Diagnosis not present

## 2016-02-26 DIAGNOSIS — I129 Hypertensive chronic kidney disease with stage 1 through stage 4 chronic kidney disease, or unspecified chronic kidney disease: Secondary | ICD-10-CM | POA: Diagnosis not present

## 2016-02-26 DIAGNOSIS — D631 Anemia in chronic kidney disease: Secondary | ICD-10-CM | POA: Diagnosis not present

## 2016-03-10 DIAGNOSIS — Z79899 Other long term (current) drug therapy: Secondary | ICD-10-CM | POA: Diagnosis not present

## 2016-03-11 ENCOUNTER — Telehealth: Payer: Self-pay | Admitting: Cardiovascular Disease

## 2016-03-11 LAB — BASIC METABOLIC PANEL
BUN: 70 mg/dL — AB (ref 7–25)
CHLORIDE: 85 mmol/L — AB (ref 98–110)
CO2: 28 mmol/L (ref 20–31)
Calcium: 9 mg/dL (ref 8.6–10.3)
Creat: 1.87 mg/dL — ABNORMAL HIGH (ref 0.70–1.11)
Glucose, Bld: 147 mg/dL — ABNORMAL HIGH (ref 65–99)
POTASSIUM: 4.9 mmol/L (ref 3.5–5.3)
Sodium: 130 mmol/L — ABNORMAL LOW (ref 135–146)

## 2016-03-11 NOTE — Telephone Encounter (Signed)
Received records from [facility] for appointment on 03/14/16 with Dr Sallyanne Kuster.  Records given to [facility] (medical records) for Dr Croitoru's schedule on 03/14/16. lp

## 2016-03-12 DIAGNOSIS — Z85828 Personal history of other malignant neoplasm of skin: Secondary | ICD-10-CM | POA: Diagnosis not present

## 2016-03-12 DIAGNOSIS — Z08 Encounter for follow-up examination after completed treatment for malignant neoplasm: Secondary | ICD-10-CM | POA: Diagnosis not present

## 2016-03-12 DIAGNOSIS — X32XXXD Exposure to sunlight, subsequent encounter: Secondary | ICD-10-CM | POA: Diagnosis not present

## 2016-03-12 DIAGNOSIS — C44629 Squamous cell carcinoma of skin of left upper limb, including shoulder: Secondary | ICD-10-CM | POA: Diagnosis not present

## 2016-03-12 DIAGNOSIS — L570 Actinic keratosis: Secondary | ICD-10-CM | POA: Diagnosis not present

## 2016-03-14 ENCOUNTER — Ambulatory Visit (INDEPENDENT_AMBULATORY_CARE_PROVIDER_SITE_OTHER): Payer: Medicare Other | Admitting: Cardiovascular Disease

## 2016-03-14 ENCOUNTER — Encounter: Payer: Self-pay | Admitting: Cardiovascular Disease

## 2016-03-14 VITALS — BP 100/68 | HR 75 | Ht 67.0 in | Wt 211.6 lb

## 2016-03-14 DIAGNOSIS — Z79899 Other long term (current) drug therapy: Secondary | ICD-10-CM

## 2016-03-14 DIAGNOSIS — I5032 Chronic diastolic (congestive) heart failure: Secondary | ICD-10-CM | POA: Diagnosis not present

## 2016-03-14 MED ORDER — AMLODIPINE BESYLATE 5 MG PO TABS: 5.0000 mg | ORAL_TABLET | Freq: Every day | ORAL | Status: DC

## 2016-03-14 NOTE — Patient Instructions (Addendum)
Medication Instructions: Dr Sallyanne Kuster has recommended making the following medication changes: 1. DECREASE Amlodipine to 5 mg daily  Labwork: Your physician recommends that you return for lab work in 33 month.  Testing/Procedures: NONE ORDERED  Follow-up: Dr Sallyanne Kuster recommends that you schedule a follow-up appointment in 6-8 weeks.  If you need a refill on your cardiac medications before your next appointment, please call your pharmacy.

## 2016-03-14 NOTE — Progress Notes (Signed)
Patient ID: Wesley [name], male   DOB: [birth year], 82 y.o.   MRN: [id number] Patient ID: Wesley [name], male   DOB: Aug 07, 1921, 82 y.o.   MRN: [id number]    Cardiology Office Note    Date:  03/14/2016   ID:  Wesley [name], DOB 28-Sep-1921, MRN [id number]  PCP:  Horatio Pel, MD  Cardiologist:   Sanda Klein, MD   Chief Complaint  Patient presents with  . Chest Pain    pt states no chest pain   . Shortness of Breath    all the time but nothing new     History of Present Illness:  Wesley [name] is a 82 y.o. male withchronic combined systolic and diastolic heart failure Who requires fairly frequent adjustment in diuretic dose due to recurrent problems with edema and dyspnea and concomitant severe renal disease.  His breathing remains pretty good, NYHA class II. His weight today is 211 pounds and we had estimated that his "dry weight" is approximately 205 pounds. His renal function seems to have stabilized around a creatinine of about 1.8-1.9. His daughter gave him some additional doses of metolazone over the last couple of days since he had gained even more fluid. Today on his home scale he weighed 206 pounds. On exam he does have clear findings of hypervolemia still (elevated JVP, 3+ fairly tight leg edema). He has been a little unsteady on his feet and occasionally staggers. His blood pressure today is low at 100/68.  His current diuretic regimen consists of furosemide 80 mg only once daily, with metolazone as needed. He takes potassium chloride 10 mEq daily. Most recent potassium level has been normal.  He has atrial fibrillation of uncertain duration, probably permanent, and remains unaware of palpitations. The heart rate is spontaneously well-controlled. He takes Xarelto adjusted for renal function. CHADSVASCS score of 4. So far he has tolerated anticoagulation without any serious bleeding problems.  Recently he has had several precancerous skin lesions removed and has had  some protracted bleeding from those sites. His daughter thinks that his dermatologist may not a been aware that he takes an anticoagulant.  He recently saw Dr. Posey Pronto in the nephrology clinic on May 30. On that day his creatinine was 123456 and his systolic blood pressure was 109.  He has severe left ventricular hypertrophy by echo but no significant hyper voltage on his electrocardiogram, raising the possibility of cardiac amyloidosis. His nuclear stress test did not raise concerns for coronary insufficiency. His echocardiogram performed in February 2017 showed a left ventricular ejection fraction of 40-45%, comparable to the ejection fraction estimated by nuclear scintigraphy in October 2016 at 46%. The echo again confirmed that his aortic stenosis is very mild. The left atrium is severely dilated. The Doppler parameters suggest that he is still volume overloaded (E/e'=14). The systolic PA pressure was estimated at approximately 50 mmHg.    Past Medical History  Diagnosis Date  . Hypertension   . Hyperlipidemia 05/07/2015  . Chronic diastolic heart failure ([place]) 05/07/2015  . Essential hypertension 05/07/2015  . Family history of adverse reaction to anesthesia     daughter has difficulty waking   . Arthritis     oa    Past Surgical History  Procedure Laterality Date  . Lower back    . Total knee arthroplasty Bilateral     Outpatient Prescriptions Prior to Visit  Medication Sig Dispense Refill  . Artificial Tear Ointment (REFRESH P.M. OP) Place 1 application into both eyes at  bedtime as needed (dry eyes).    . brimonidine (ALPHAGAN P) 0.1 % SOLN Place 1 drop into both eyes daily.    . Carboxymethylcellulose Sodium (THERATEARS OP) Place 1 drop into both eyes 5 (five) times daily.    . Cholecalciferol (VITAMIN D3) 5000 units TABS Take 5,000 Units by mouth daily.    . furosemide (LASIX) 40 MG tablet Take 80 mg by mouth daily.    Marland Kitchen KLOR-CON M10 10 MEQ tablet Take 2 tablets by mouth daily. Take 1  tab by mouth daily  3  . latanoprost (XALATAN) 0.005 % ophthalmic solution Place 1 drop into both eyes 2 (two) times daily. Place 1 drop into both eyes 2 (two) times daily.    . metolazone (ZAROXOLYN) 5 MG tablet Take 1 tablet (5 mg total) by mouth as directed. Take 1 tab by mouth when weight is over 205lbs 30 tablet 8  . Probiotic Product (PROBIOTIC PO) Take 1 tablet by mouth at bedtime.    . Rivaroxaban (XARELTO) 15 MG TABS tablet Take 1 tablet (15 mg total) by mouth daily with supper. 90 tablet 3  . Tamsulosin HCl (FLOMAX) 0.4 MG CAPS Take 0.4 mg by mouth daily.     . Travoprost, BAK Free, (TRAVATAN) 0.004 % SOLN ophthalmic solution Place 1 drop into both eyes at bedtime.    . vitamin C (ASCORBIC ACID) 500 MG tablet Take 500 mg by mouth daily.    Marland Kitchen amLODipine (NORVASC) 10 MG tablet Take 1 tablet (10 mg total) by mouth daily. 90 tablet 3   No facility-administered medications prior to visit.     Allergies:   Neosporin and Penicillins   Social History   Social History  . Marital Status: Widowed    Spouse Name: N/A  . Number of Children: N/A  . Years of Education: N/A   Social History Main Topics  . Smoking status: Former Research scientist (life sciences)  . Smokeless tobacco: Never Used  . Alcohol Use: No  . Drug Use: No  . Sexual Activity: Not Asked   Other Topics Concern  . None   Social History Narrative     Family History:  The patient's family history includes Diabetes in his son; Heart Problems in his mother; Hypertension in his daughter; Kidney disease in his sister; Stroke in his father.   ROS:   Please see the history of present illness.    ROS All other systems reviewed and are negative.   PHYSICAL EXAM:   VS:  BP 100/68 mmHg  Pulse 75  Ht 5\' 7"  (1.702 m)  Wt 95.981 kg (211 lb 9.6 oz)  BMI 33.13 kg/m2  SpO2 92%   GEN: Well nourished, well developed, in no acute distress HEENT: normal Neck: no JVD, carotid bruits, or masses Cardiac: irregular; 2/6 early peaking right upper  sternal border systolic ejection murmur, no diastolic murmurs, rubs, or gallops, symmetrical 2+ symmetrical pitting calf and ankle edema  Respiratory:  clear to auscultation bilaterally, normal work of breathing GI: soft, nontender, nondistended, + BS MS: no deformity or atrophy Skin: warm and dry, no rash Neuro:  Alert and Oriented x 3, Strength and sensation are intact Psych: euthymic mood, full affect  Wt Readings from Last 3 Encounters:  03/14/16 95.981 kg (211 lb 9.6 oz)  01/30/16 98.612 kg (217 lb 6.4 oz)  12/26/15 102.967 kg (227 lb)      Studies/Labs Reviewed:   EKG:  EKG is  ordered today.  Atrial fibrillation 56 bpm, right bundle branch block  and left anterior fascicular block, QTC 451 ms  Recent Labs: 11/02/2015: ALT 26; B Natriuretic Peptide 397.6* 11/05/2015: Hemoglobin 13.4; Platelets 146* 12/05/2015: Magnesium 2.4 01/30/2016: TSH 3.04 03/10/2016: BUN 70*; Creat 1.87*; Potassium 4.9; Sodium 130*     ASSESSMENT:    1. Chronic diastolic heart failure ([place])   2. Medication management      PLAN:  In order of problems listed above:  1. CHF: He seems to have primarily diastolic dysfunction, possible restrictive/infiltrative cardiomyopathy in this elderly man with severe left atrial dilatation and relatively low voltage on ECG, possible amyloidosis. He still appears to still have mild hypervolemia. Will keep his furosemide at 80 mg and metolazone on an as-needed basis, to attempt to keep his weight under 205 pounds (as measured by his home scale). Okay to temporarily increase furosemide to 80 mg twice a day. Asked his daughter to always call if he requires increased diuretic doses for more than 3 consecutive days since we will have to check potassium levels and renal function. Until we settle on a more stable diuretic regimen will also recommend at least monthly metabolic panels. Reviewed daily weights, signs and symptoms of heart failure exacerbation, sodium  restriction. 2. AFib: Likely a permanent arrhythmia in view of the severe left atrial dilatation and his advanced age. He has spontaneously controlled ventricular rate. He is on appropriate anticoagulation, so far without bleeding complications. I promised the family I would let Dr. [place] Crane know that he is taking anticoagulants. Continue same medications 3. Anticoagulation monitor renal function periodically and consider switching to Eliquis if renal function deteriorates further 4. AS: this does not appear to be hemodynamically significant or contributing to heart failure 5. HTN: Blood pressure may be excessively well controlled. Reduce amlodipine to 5 mg daily 6. CKD stage 3-4: Avoiding RAAS inhibitors and spironolactone due to recent hyperkalemia and rather volatile renal function. Monitor renal function carefully during periods of increased diuresis  Medication Adjustments/Labs and Tests Ordered: Current medicines are reviewed at length with the patient today.  Concerns regarding medicines are outlined above.  Medication changes, Labs and Tests ordered today are listed in the Patient Instructions below. Patient Instructions  Medication Instructions: Dr Sallyanne Kuster has recommended making the following medication changes: 1. DECREASE Amlodipine to 5 mg daily  Labwork: Your physician recommends that you return for lab work in 47 month.  Testing/Procedures: NONE ORDERED  Follow-up: Dr Sallyanne Kuster recommends that you schedule a follow-up appointment in 6-8 weeks.  If you need a refill on your cardiac medications before your next appointment, please call your pharmacy.   Signed, Sanda Klein, MD  03/14/2016 6:35 PM    [facility], [place], [place]  [address] Phone: [phone]; Fax: [phone]   Patient ID: Wesley [name], male   DOB: September 18, 1921, 82 y.o.   MRN: [id number]

## 2016-03-23 ENCOUNTER — Other Ambulatory Visit: Payer: Self-pay | Admitting: Physician Assistant

## 2016-03-24 NOTE — Telephone Encounter (Signed)
Rx request sent to pharmacy.  

## 2016-04-02 ENCOUNTER — Other Ambulatory Visit: Payer: Self-pay | Admitting: Cardiovascular Disease

## 2016-04-08 ENCOUNTER — Telehealth: Payer: Self-pay | Admitting: Cardiovascular Disease

## 2016-04-08 ENCOUNTER — Other Ambulatory Visit: Payer: Self-pay

## 2016-04-08 DIAGNOSIS — R5383 Other fatigue: Secondary | ICD-10-CM

## 2016-04-08 DIAGNOSIS — Z79899 Other long term (current) drug therapy: Secondary | ICD-10-CM

## 2016-04-08 MED ORDER — KLOR-CON M10 10 MEQ PO TBCR: 20.0000 meq | EXTENDED_RELEASE_TABLET | Freq: Every day | ORAL | Status: DC

## 2016-04-08 NOTE — Telephone Encounter (Signed)
Spoke with patients daughter and patient did have a weight gain up to 209, increased shortness of breath, and swelling in ankles/feet Daughter gave him Metolazone 5 mg daily for about 4-5 days Weight is down to 198 and swelling better She decreased his Furosemide to 80 mg in am and 40 mg around 2 pm Patient has decreased energy and increased fatigue Daughter does not know of any bleeding or dark stools She would like some lab work done on the patient Will forward to Dr [employer] for review

## 2016-04-08 NOTE — Telephone Encounter (Signed)
New message   Pt daughter is calling to have labs drawn asap  She said she feels like his labs needs to be checked   and then possibly it will discover anything that he is lacking  Please call pt

## 2016-04-08 NOTE — Telephone Encounter (Signed)
Please check CMET , Mg and H/H. may have some hypokalemia or may have had excessive diuresis.

## 2016-04-09 DIAGNOSIS — R5383 Other fatigue: Secondary | ICD-10-CM | POA: Diagnosis not present

## 2016-04-09 DIAGNOSIS — Z79899 Other long term (current) drug therapy: Secondary | ICD-10-CM | POA: Diagnosis not present

## 2016-04-09 DIAGNOSIS — H401112 Primary open-angle glaucoma, right eye, moderate stage: Secondary | ICD-10-CM | POA: Diagnosis not present

## 2016-04-09 LAB — CBC
HCT: 39.6 % (ref 38.5–50.0)
Hemoglobin: 13.2 g/dL (ref 13.2–17.1)
MCH: 30.3 pg (ref 27.0–33.0)
MCHC: 33.3 g/dL (ref 32.0–36.0)
MCV: 91 fL (ref 80.0–100.0)
MPV: 9.4 fL (ref 7.5–12.5)
PLATELETS: 219 10*3/uL (ref 140–400)
RBC: 4.35 MIL/uL (ref 4.20–5.80)
RDW: 14.2 % (ref 11.0–15.0)
WBC: 9.2 10*3/uL (ref 3.8–10.8)

## 2016-04-09 NOTE — Telephone Encounter (Signed)
Labs ordered. Patient's daughter notified and she will bring patient to have labs done today.

## 2016-04-10 ENCOUNTER — Other Ambulatory Visit: Payer: Self-pay | Admitting: *Deleted

## 2016-04-10 DIAGNOSIS — Z79899 Other long term (current) drug therapy: Secondary | ICD-10-CM

## 2016-04-10 LAB — COMPREHENSIVE METABOLIC PANEL
ALK PHOS: 67 U/L (ref 40–115)
ALT: 17 U/L (ref 9–46)
AST: 33 U/L (ref 10–35)
Albumin: 4.1 g/dL (ref 3.6–5.1)
BUN: 70 mg/dL — AB (ref 7–25)
CALCIUM: 9.3 mg/dL (ref 8.6–10.3)
CO2: 32 mmol/L — ABNORMAL HIGH (ref 20–31)
Chloride: 87 mmol/L — ABNORMAL LOW (ref 98–110)
Creat: 1.99 mg/dL — ABNORMAL HIGH (ref 0.70–1.11)
Glucose, Bld: 118 mg/dL — ABNORMAL HIGH (ref 65–99)
POTASSIUM: 3.4 mmol/L — AB (ref 3.5–5.3)
Sodium: 135 mmol/L (ref 135–146)
TOTAL PROTEIN: 6.5 g/dL (ref 6.1–8.1)
Total Bilirubin: 1.5 mg/dL — ABNORMAL HIGH (ref 0.2–1.2)

## 2016-04-10 LAB — MAGNESIUM: MAGNESIUM: 2.6 mg/dL — AB (ref 1.5–2.5)

## 2016-04-15 ENCOUNTER — Other Ambulatory Visit: Payer: Self-pay

## 2016-04-15 MED ORDER — KLOR-CON M10 10 MEQ PO TBCR: EXTENDED_RELEASE_TABLET | ORAL | Status: DC

## 2016-04-15 NOTE — Telephone Encounter (Signed)
Rx(s) sent to pharmacy electronically.  

## 2016-04-29 DIAGNOSIS — Z79899 Other long term (current) drug therapy: Secondary | ICD-10-CM | POA: Diagnosis not present

## 2016-04-30 LAB — BASIC METABOLIC PANEL
BUN: 73 mg/dL — ABNORMAL HIGH (ref 7–25)
CHLORIDE: 95 mmol/L — AB (ref 98–110)
CO2: 30 mmol/L (ref 20–31)
CREATININE: 2.18 mg/dL — AB (ref 0.70–1.11)
Calcium: 9.3 mg/dL (ref 8.6–10.3)
Glucose, Bld: 173 mg/dL — ABNORMAL HIGH (ref 65–99)
Potassium: 5.1 mmol/L (ref 3.5–5.3)
Sodium: 136 mmol/L (ref 135–146)

## 2016-05-05 ENCOUNTER — Telehealth: Payer: Self-pay | Admitting: Cardiovascular Disease

## 2016-05-05 NOTE — Telephone Encounter (Signed)
Please call,pt have had a big weight gain since last week.He gained 91lbs in about 4 days.

## 2016-05-05 NOTE — Telephone Encounter (Signed)
Returned call to patient's daughter Wells Guiles.She stated father has gained 10 lbs in the past 5 days.Stated lasix was just deceased to 80 mg daily this past Fri 05/02/16.Stated father refuses to drink water.Stated he is very hard to manage.Spoke to Dr.Croitoru he advised take metolazone 5 mg now and keep appointment with him Wed 05/07/16 at 3:15 pm.

## 2016-05-07 ENCOUNTER — Encounter (INDEPENDENT_AMBULATORY_CARE_PROVIDER_SITE_OTHER): Payer: Self-pay

## 2016-05-07 ENCOUNTER — Encounter: Payer: Self-pay | Admitting: Cardiovascular Disease

## 2016-05-07 ENCOUNTER — Ambulatory Visit (INDEPENDENT_AMBULATORY_CARE_PROVIDER_SITE_OTHER): Payer: Medicare Other | Admitting: Cardiovascular Disease

## 2016-05-07 VITALS — BP 138/72 | HR 68 | Ht 66.0 in | Wt 210.0 lb

## 2016-05-07 DIAGNOSIS — N183 Chronic kidney disease, stage 3 unspecified: Secondary | ICD-10-CM

## 2016-05-07 DIAGNOSIS — I10 Essential (primary) hypertension: Secondary | ICD-10-CM

## 2016-05-07 DIAGNOSIS — I350 Nonrheumatic aortic (valve) stenosis: Secondary | ICD-10-CM | POA: Diagnosis not present

## 2016-05-07 DIAGNOSIS — N189 Chronic kidney disease, unspecified: Secondary | ICD-10-CM

## 2016-05-07 DIAGNOSIS — I481 Persistent atrial fibrillation: Secondary | ICD-10-CM | POA: Diagnosis not present

## 2016-05-07 DIAGNOSIS — I5043 Acute on chronic combined systolic (congestive) and diastolic (congestive) heart failure: Secondary | ICD-10-CM | POA: Diagnosis not present

## 2016-05-07 DIAGNOSIS — I4819 Other persistent atrial fibrillation: Secondary | ICD-10-CM

## 2016-05-07 MED ORDER — ASPIRIN EC 81 MG PO TBEC: 81.0000 mg | DELAYED_RELEASE_TABLET | Freq: Every day | ORAL | 3 refills | Status: AC

## 2016-05-07 MED ORDER — AMLODIPINE BESYLATE 2.5 MG PO TABS: 2.5000 mg | ORAL_TABLET | Freq: Every day | ORAL | 11 refills | Status: DC

## 2016-05-07 MED ORDER — METOLAZONE 5 MG PO TABS: ORAL_TABLET | ORAL | 5 refills | Status: DC

## 2016-05-07 MED ORDER — KLOR-CON M10 10 MEQ PO TBCR: EXTENDED_RELEASE_TABLET | ORAL | 11 refills | Status: DC

## 2016-05-07 NOTE — Progress Notes (Signed)
Patient ID: Wesley [name], male   DOB: December 15, 1920, 82 y.o.   MRN: [id number] Patient ID: Wesley [name], male   DOB: [birth year], 82 y.o.   MRN: [id number]    Cardiology Office Note    Date:  05/08/2016   ID:  Wesley [name], DOB November 02, 1920, MRN [id number]  PCP:  Horatio Pel, MD  Cardiologist:   Sanda Klein, MD   Chief Complaint  Patient presents with  . Follow-up    6-8 WEEKS    History of Present Illness:  Wesley [name] is a 82 y.o. male withchronic combined systolic and diastolic heart failure Who requires fairly frequent adjustment in diuretic dose due to recurrent problems with edema and dyspnea and concomitant severe renal disease.  The most worrisome developments is the fact that he continues to have frequent falls. Recently he fell backwards on the concrete and struck his head. In about 2 weeks ago. His family has not noticed any alteration in his personality, level of consciousness, vision problems or other neurological complaints. He denies any headaches. He has not had overt bleeding. This is just 1 of 4 serious falls he has had recently. CHADSVASCS score of 4. So far he has tolerated anticoagulation without any serious bleeding problems.  His weight continues to fluctuate quite a bit. His daughter is worried that he doesn't drink enough water and that this may be contributing to his worsening creatinine. However looking through his labs over the last several weeks his renal function has been quite stable and his potassium is usually normal range despite frequent adjustments in diuretics.  He seems to require metolazone a minimum of twice weekly, sometimes a third dose.  He has severe left ventricular hypertrophy by echo but no significant hyper voltage on his electrocardiogram, raising the possibility of cardiac amyloidosis. His nuclear stress test did not raise concerns for coronary insufficiency. His echocardiogram performed in February 2017 showed a left ventricular  ejection fraction of 40-45%, comparable to the ejection fraction estimated by nuclear scintigraphy in October 2016 at 46%. The echo again confirmed that his aortic stenosis is very mild. The left atrium is severely dilated. The Doppler parameters suggest that he is still volume overloaded (E/e'=14). The systolic PA pressure was estimated at approximately 50 mmHg.    Past Medical History:  Diagnosis Date  . Arthritis    oa  . Chronic diastolic heart failure ([place]) 05/07/2015  . Essential hypertension 05/07/2015  . Family history of adverse reaction to anesthesia    daughter has difficulty waking   . Hyperlipidemia 05/07/2015  . Hypertension     Past Surgical History:  Procedure Laterality Date  . lower back    . TOTAL KNEE ARTHROPLASTY Bilateral     Outpatient Medications Prior to Visit  Medication Sig Dispense Refill  . Artificial Tear Ointment (REFRESH P.M. OP) Place 1 application into both eyes at bedtime as needed (dry eyes).    . brimonidine (ALPHAGAN P) 0.1 % SOLN Place 1 drop into both eyes daily.    . Carboxymethylcellulose Sodium (THERATEARS OP) Place 1 drop into both eyes 5 (five) times daily.    . Cholecalciferol (VITAMIN D3) 5000 units TABS Take 5,000 Units by mouth daily.    . furosemide (LASIX) 40 MG tablet TAKE 2 TABLETS (80 MG TOTAL) BY MOUTH 2 (TWO) TIMES DAILY. 90 tablet 3  . latanoprost (XALATAN) 0.005 % ophthalmic solution Place 1 drop into both eyes 2 (two) times daily. Place 1 drop into both eyes 2 (two)  times daily.    . Probiotic Product (PROBIOTIC PO) Take 1 tablet by mouth at bedtime.    . Tamsulosin HCl (FLOMAX) 0.4 MG CAPS Take 0.4 mg by mouth daily.     . Travoprost, BAK Free, (TRAVATAN) 0.004 % SOLN ophthalmic solution Place 1 drop into both eyes at bedtime.    . vitamin C (ASCORBIC ACID) 500 MG tablet Take 500 mg by mouth daily.    Marland Kitchen amLODipine (NORVASC) 10 MG tablet TAKE 1 TABLET (10 MG TOTAL) BY MOUTH DAILY. (Patient taking differently: TAKE 1 TABLET (5 MG  TOTAL) BY MOUTH DAILY.) 30 tablet 2  . KLOR-CON M10 10 MEQ tablet Take 1-2 tablets (10-20 mEq total) by mouth daily as directed. (Patient taking differently: Take 20 mEq by mouth 2 (two) times daily. Take 2 TABLETS IN THE MORNING AND TWO TABLETS AT NIGHT.) 180 tablet 3  . metolazone (ZAROXOLYN) 5 MG tablet Take 1 tablet (5 mg total) by mouth as directed. Take 1 tab by mouth when weight is over 205lbs 30 tablet 8  . Rivaroxaban (XARELTO) 15 MG TABS tablet Take 1 tablet (15 mg total) by mouth daily with supper. 90 tablet 3   No facility-administered medications prior to visit.      Allergies:   Neosporin [neomycin-bacitracin zn-polymyx] and Penicillins   Social History   Social History  . Marital status: Widowed    Spouse name: N/A  . Number of children: N/A  . Years of education: N/A   Social History Main Topics  . Smoking status: Former Research scientist (life sciences)  . Smokeless tobacco: Never Used  . Alcohol use No  . Drug use: No  . Sexual activity: Not Asked   Other Topics Concern  . None   Social History Narrative  . None     Family History:  The patient's family history includes Diabetes in his son; Heart Problems in his mother; Hypertension in his daughter; Kidney disease in his sister; Stroke in his father.   ROS:   Please see the history of present illness.    ROS All other systems reviewed and are negative.   PHYSICAL EXAM:   VS:  BP 138/72   Pulse 68   Ht 5\' 6"  (1.676 m)   Wt 210 lb (95.3 kg)   BMI 33.89 kg/m    GEN: Well nourished, well developed, in no acute distress  HEENT: normal  Neck: no JVD, carotid bruits, or masses Cardiac: irregular; 2/6 early peaking right upper sternal border systolic ejection murmur, no diastolic murmurs, rubs, or gallops, symmetrical 2+ symmetrical pitting calf and ankle edema  Respiratory:  clear to auscultation bilaterally, normal work of breathing GI: soft, nontender, nondistended, + BS MS: no deformity or atrophy  Skin: warm and dry, no  rash Neuro:  Alert and Oriented x 3, Strength and sensation are intact Psych: euthymic mood, full affect  Wt Readings from Last 3 Encounters:  05/07/16 210 lb (95.3 kg)  03/14/16 211 lb 9.6 oz (96 kg)  01/30/16 217 lb 6.4 oz (98.6 kg)      Studies/Labs Reviewed:   EKG:  EKG is  ordered today.  Atrial fibrillation 68 bpm, right bundle branch block and left anterior fascicular block, QTC 451 ms. Inferior and anterolateral ST-T wave changes are not different from previous tracings  Recent Labs: 11/02/2015: B Natriuretic Peptide 397.6 01/30/2016: TSH 3.04 04/09/2016: ALT 17; Hemoglobin 13.2; Magnesium 2.6; Platelets 219 04/29/2016: BUN 73; Creat 2.18; Potassium 5.1; Sodium 136     ASSESSMENT:  1. Acute on chronic combined systolic (congestive) and diastolic (congestive) heart failure ([facility])   2. Persistent atrial fibrillation ([place])   3. Aortic stenosis, mild   4. Essential hypertension   5. Chronic renal insufficiency, stage III (moderate)      PLAN:  In order of problems listed above:  1. CHF: He seems to have primarily diastolic dysfunction, possible restrictive/infiltrative cardiomyopathy in this elderly man with severe left atrial dilatation and relatively low voltage on ECG, possible amyloidosis. He still appears to still have mild hypervolemia. Will keep his furosemide at 80 mg Daily and scheduled the metolazone Twice weekly on Wednesdays and Saturdays with the option for her third dose on Mondays if needed for edema and dyspnea, to attempt to keep his weight under 205 pounds (as measured by his home scale). Continue at least monthly metabolic panels. Reviewed daily weights, signs and symptoms of heart failure exacerbation, sodium restriction. 2. AFib: Likely a permanent arrhythmia in view of the severe left atrial dilatation and his advanced age. He has spontaneously controlled ventricular rate. However, in view of his frequent falls, advanced age and overall frailty, the  anticoagulant seems to now be more of a liability then potential benefit. We'll discontinue Xarelto and start low-dose aspirin only. I doubt that he would benefit from invasive Watchman procedure at his age. 3. AS: this does not appear to be hemodynamically significant or contributing to heart failure 4. HTN: Blood pressure may be excessively well controlled. Reduce amlodipine further to 2.5 mg daily 5. CKD stage 3-4: Avoiding RAAS inhibitors and spironolactone due to recent hyperkalemia and rather volatile renal function. Monitor renal function carefully during periods of increased diuresis  Medication Adjustments/Labs and Tests Ordered: Current medicines are reviewed at length with the patient today.  Concerns regarding medicines are outlined above.  Medication changes, Labs and Tests ordered today are listed in the Patient Instructions below. Patient Instructions  Dr Sallyanne Kuster has recommended making the following medication changes: 1. STOP Xarelto 2. START Aspirin 81 mg - take 1 tablet by mouth daily 3. DECREASE Amlodipine to 2.5 mg daily 4. TAKE Metolazone on Wednesdays and Saturdays. May take additional tablet on Mondays if weight is 205 pounds or more 5. DOUBLE Potassium on the days you take metolazone  Dr C recommends that you schedule a follow-up appointment in 1 month.  If you need a refill on your cardiac medications before your next appointment, please call your pharmacy.  Signed, Sanda Klein, MD  05/08/2016 6:25 PM    [facility], [place], [place]  [address] Phone: [phone]; Fax: [phone]   Patient ID: Wesley [name], male   DOB: July 31, 1921, 82 y.o.   MRN: [id number]

## 2016-05-07 NOTE — Patient Instructions (Addendum)
Dr Sallyanne Kuster has recommended making the following medication changes: 1. STOP Xarelto 2. START Aspirin 81 mg - take 1 tablet by mouth daily 3. DECREASE Amlodipine to 2.5 mg daily 4. TAKE Metolazone on Wednesdays and Saturdays. May take additional tablet on Mondays if weight is 205 pounds or more 5. DOUBLE Potassium on the days you take metolazone  Dr C recommends that you schedule a follow-up appointment in 1 month.  If you need a refill on your cardiac medications before your next appointment, please call your pharmacy.

## 2016-05-09 ENCOUNTER — Emergency Department (HOSPITAL_COMMUNITY)
Admission: EM | Admit: 2016-05-09 | Discharge: 2016-05-09 | Disposition: A | Discharge status: Home / Self Care | Payer: Medicare Other | Attending: Emergency Medicine | Admitting: Emergency Medicine

## 2016-05-09 ENCOUNTER — Emergency Department (HOSPITAL_COMMUNITY): Payer: Medicare Other

## 2016-05-09 ENCOUNTER — Encounter (HOSPITAL_COMMUNITY): Payer: Self-pay | Admitting: *Deleted

## 2016-05-09 DIAGNOSIS — Y999 Unspecified external cause status: Secondary | ICD-10-CM | POA: Diagnosis not present

## 2016-05-09 DIAGNOSIS — Y929 Unspecified place or not applicable: Secondary | ICD-10-CM | POA: Diagnosis not present

## 2016-05-09 DIAGNOSIS — Z79899 Other long term (current) drug therapy: Secondary | ICD-10-CM | POA: Insufficient documentation

## 2016-05-09 DIAGNOSIS — I5032 Chronic diastolic (congestive) heart failure: Secondary | ICD-10-CM | POA: Insufficient documentation

## 2016-05-09 DIAGNOSIS — S51012A Laceration without foreign body of left elbow, initial encounter: Secondary | ICD-10-CM | POA: Insufficient documentation

## 2016-05-09 DIAGNOSIS — I6782 Cerebral ischemia: Secondary | ICD-10-CM | POA: Diagnosis not present

## 2016-05-09 DIAGNOSIS — Y939 Activity, unspecified: Secondary | ICD-10-CM | POA: Insufficient documentation

## 2016-05-09 DIAGNOSIS — Z7982 Long term (current) use of aspirin: Secondary | ICD-10-CM | POA: Diagnosis not present

## 2016-05-09 DIAGNOSIS — I110 Hypertensive heart disease with heart failure: Secondary | ICD-10-CM | POA: Diagnosis not present

## 2016-05-09 DIAGNOSIS — R296 Repeated falls: Secondary | ICD-10-CM | POA: Diagnosis not present

## 2016-05-09 DIAGNOSIS — S41112A Laceration without foreign body of left upper arm, initial encounter: Secondary | ICD-10-CM

## 2016-05-09 DIAGNOSIS — W19XXXA Unspecified fall, initial encounter: Secondary | ICD-10-CM | POA: Insufficient documentation

## 2016-05-09 DIAGNOSIS — S4992XA Unspecified injury of left shoulder and upper arm, initial encounter: Secondary | ICD-10-CM | POA: Diagnosis not present

## 2016-05-09 DIAGNOSIS — S21212A Laceration without foreign body of left back wall of thorax without penetration into thoracic cavity, initial encounter: Secondary | ICD-10-CM | POA: Diagnosis not present

## 2016-05-09 DIAGNOSIS — T148 Other injury of unspecified body region: Secondary | ICD-10-CM | POA: Diagnosis not present

## 2016-05-09 DIAGNOSIS — S41012A Laceration without foreign body of left shoulder, initial encounter: Secondary | ICD-10-CM | POA: Insufficient documentation

## 2016-05-09 LAB — BASIC METABOLIC PANEL
Anion gap: 10 (ref 5–15)
BUN: 54 mg/dL — AB (ref 6–20)
CHLORIDE: 91 mmol/L — AB (ref 101–111)
CO2: 32 mmol/L (ref 22–32)
Calcium: 9.1 mg/dL (ref 8.9–10.3)
Creatinine, Ser: 1.95 mg/dL — ABNORMAL HIGH (ref 0.61–1.24)
GFR, EST AFRICAN AMERICAN: 32 mL/min — AB (ref >60)
GFR, EST NON AFRICAN AMERICAN: 28 mL/min — AB (ref >60)
Glucose, Bld: 111 mg/dL — ABNORMAL HIGH (ref 65–99)
POTASSIUM: 3.5 mmol/L (ref 3.5–5.1)
SODIUM: 133 mmol/L — AB (ref 135–145)

## 2016-05-09 LAB — CBC WITH DIFFERENTIAL/PLATELET
Basophils Absolute: 0 10*3/uL (ref 0.0–0.1)
Basophils Relative: 0 %
EOS ABS: 0.1 10*3/uL (ref 0.0–0.7)
Eosinophils Relative: 1 %
HEMATOCRIT: 37.7 % — AB (ref 39.0–52.0)
HEMOGLOBIN: 12.8 g/dL — AB (ref 13.0–17.0)
LYMPHS ABS: 1 10*3/uL (ref 0.7–4.0)
LYMPHS PCT: 11 %
MCH: 30.5 pg (ref 26.0–34.0)
MCHC: 34 g/dL (ref 30.0–36.0)
MCV: 90 fL (ref 78.0–100.0)
Monocytes Absolute: 1.1 10*3/uL — ABNORMAL HIGH (ref 0.1–1.0)
Monocytes Relative: 13 %
NEUTROS ABS: 6.5 10*3/uL (ref 1.7–7.7)
NEUTROS PCT: 75 %
Platelets: 189 10*3/uL (ref 150–400)
RBC: 4.19 MIL/uL — AB (ref 4.22–5.81)
RDW: 14.4 % (ref 11.5–15.5)
WBC: 8.7 10*3/uL (ref 4.0–10.5)

## 2016-05-09 MED ORDER — SILVER SULFADIAZINE 1 % EX CREA
TOPICAL_CREAM | Freq: Once | CUTANEOUS | Status: AC
  Administered 2016-05-09: 11:00:00 via TOPICAL
  Filled 2016-05-09: qty 50

## 2016-05-09 NOTE — ED Provider Notes (Signed)
[facility] DEPT Provider Note   CSN: [id number] Arrival date & time: 05/09/16  [id number]  First Provider Contact:  First MD Initiated Contact with Patient 05/09/16 [phone]        History   Chief Complaint Chief Complaint  Patient presents with  . Skin Problem    HPI Wesley [name] is a 82 y.o. male.  The history is provided by the patient and a relative.  Fall  This is a recurrent problem. The current episode started yesterday. The problem occurs constantly. The problem has not changed since onset.Pertinent negatives include no chest pain, no abdominal pain, no headaches and no shortness of breath. Nothing aggravates the symptoms. Nothing relieves the symptoms. He has tried nothing for the symptoms.    Past Medical History:  Diagnosis Date  . Arthritis    oa  . Chronic diastolic heart failure ([place]) 05/07/2015  . Essential hypertension 05/07/2015  . Family history of adverse reaction to anesthesia    daughter has difficulty waking   . Hyperlipidemia 05/07/2015  . Hypertension     Patient Active Problem List   Diagnosis Date Noted  . Persistent atrial fibrillation ([place]) 11/21/2015  . Chronic renal insufficiency, stage III (moderate) 11/07/2015  . New onset atrial flutter ([place]) 11/07/2015  . Aortic stenosis, mild 11/07/2015  . Pulmonary hypertension- PA 50 mmHg 11/07/2015  . Chronic anticoagulation-Xarelto 11/07/2015  . Acute on chronic combined systolic (congestive) and diastolic (congestive) heart failure ([place]) 11/02/2015  . Chronic diastolic heart failure ([place]) 05/07/2015  . Essential hypertension 05/07/2015  . Hyperlipidemia 05/07/2015  . History of shingles 05/07/2015    Past Surgical History:  Procedure Laterality Date  . lower back    . TOTAL KNEE ARTHROPLASTY Bilateral        Home Medications    Prior to Admission medications   Medication Sig Start Date End Date Taking? Authorizing Provider  amLODipine (NORVASC) 2.5 MG tablet Take 1 tablet (2.5 mg total) by  mouth daily. 05/07/16  Yes Mihai Croitoru, MD  Artificial Tear Ointment (REFRESH P.M. OP) Place 1 application into both eyes at bedtime as needed (dry eyes).   Yes Historical Provider, MD  aspirin EC 81 MG tablet Take 1 tablet (81 mg total) by mouth daily. 05/07/16  Yes Mihai Croitoru, MD  brimonidine (ALPHAGAN P) 0.1 % SOLN Place 1 drop into both eyes daily.   Yes Historical Provider, MD  Carboxymethylcellulose Sodium (THERATEARS OP) Place 1 drop into both eyes 5 (five) times daily as needed (for dry eyes (in between Alphagan and travatan)).    Yes Historical Provider, MD  Cholecalciferol (VITAMIN D3) 5000 units TABS Take 5,000 Units by mouth daily.   Yes Historical Provider, MD  furosemide (LASIX) 40 MG tablet TAKE 2 TABLETS (80 MG TOTAL) BY MOUTH 2 (TWO) TIMES DAILY. Patient taking differently: TAKE 2 TABLETS (80 MG TOTAL) BY MOUTH once daily 04/02/16  Yes Mihai Croitoru, MD  KLOR-CON M10 10 MEQ tablet Take 2-4 tablets (20-40 mEq total) by mouth twice daily. May take additional tablets on Wednesdays and Saturdays, Mondays as needed. Patient taking differently: Take 10-20 mEq by mouth as directed. Take 6meq by mouth twice daily. On days pt takes metolazone pt take 37meq in the morning and then 103meq in the evening for a total of 40 meq throughout the day. (afternoon meds are given around 1400) 05/07/16  Yes Mihai Croitoru, MD  metolazone (ZAROXOLYN) 5 MG tablet Take 1 tablet (5 mg total) by mouth on Wednesdays and Saturdays. May take  an extra tablet on Mondays if weight is 205 pounds or more. Patient taking differently: Take 5 mg by mouth as directed. Take 1 tablet (5 mg total) by mouth on Wednesdays and Saturdays. And May take an extra tablet on Mondays if needed for when weight is 205 pounds or more. 05/07/16  Yes Mihai Croitoru, MD  Probiotic Product (PROBIOTIC PO) Take 1 tablet by mouth daily as needed (for stomach irritation).    Yes Historical Provider, MD  Tamsulosin HCl (FLOMAX) 0.4 MG CAPS Take 0.4 mg  by mouth daily.  07/29/12  Yes Historical Provider, MD  Travoprost, BAK Free, (TRAVATAN) 0.004 % SOLN ophthalmic solution Place 1 drop into both eyes at bedtime.   Yes Historical Provider, MD  vitamin C (ASCORBIC ACID) 500 MG tablet Take 500 mg by mouth daily.   Yes Historical Provider, MD    Family History Family History  Problem Relation Age of Onset  . Heart Problems Mother   . Stroke Father   . Kidney disease Sister   . Hypertension Daughter   . Diabetes Son     Social History Social History  Substance Use Topics  . Smoking status: Former Research scientist (life sciences)  . Smokeless tobacco: Never Used  . Alcohol use No     Allergies   Neosporin [neomycin-bacitracin zn-polymyx] and Penicillins   Review of Systems Review of Systems  Respiratory: Negative for shortness of breath.   Cardiovascular: Negative for chest pain.  Gastrointestinal: Negative for abdominal pain.  Skin: Positive for wound.  Neurological: Negative for headaches.  All other systems reviewed and are negative.    Physical Exam Updated Vital Signs BP 113/61 (BP Location: Right Arm)   Pulse 70   Temp 97.2 F (36.2 C) (Oral)   Resp 17   SpO2 98%   Physical Exam  Constitutional: He is oriented to person, place, and time. He appears well-developed and well-nourished. No distress.  HENT:  Head: Normocephalic and atraumatic.  Eyes: Conjunctivae are normal.  Neck: Neck supple. No tracheal deviation present.  Cardiovascular: Normal rate, regular rhythm and normal heart sounds.   Pulmonary/Chest: Effort normal and breath sounds normal. No respiratory distress.  Abdominal: Soft. He exhibits no distension.  Neurological: He is alert and oriented to person, place, and time.  Skin: Skin is warm and dry.     Large diffuse desquamated area oozing blood over left shoulder with several smaller skin tears over back and left elbow  Psychiatric: He has a normal mood and affect.     ED Treatments / Results  Labs (all labs  ordered are listed, but only abnormal results are displayed) Labs Reviewed  CBC WITH DIFFERENTIAL/PLATELET - Abnormal; Notable for the following:       Result Value   RBC 4.19 (*)    Hemoglobin 12.8 (*)    HCT 37.7 (*)    Monocytes Absolute 1.1 (*)    All other components within normal limits  BASIC METABOLIC PANEL - Abnormal; Notable for the following:    Sodium 133 (*)    Chloride 91 (*)    Glucose, Bld 111 (*)    BUN 54 (*)    Creatinine, Ser 1.95 (*)    GFR calc non Af Amer 28 (*)    GFR calc Af Amer 32 (*)    All other components within normal limits    EKG  EKG Interpretation None       Radiology Ct Head Wo Contrast  Result Date: 05/09/2016 CLINICAL DATA:  Multiple falls.  EXAM: CT HEAD WITHOUT CONTRAST TECHNIQUE: Contiguous axial images were obtained from the base of the skull through the vertex without intravenous contrast. COMPARISON:  None. FINDINGS: Bony calvarium is unremarkable. Mild diffuse cortical atrophy is noted. Moderate chronic ischemic white matter disease is noted. No mass effect or midline shift is noted. Ventricular size is within normal limits. There is no evidence of mass lesion, hemorrhage or acute infarction. IMPRESSION: Mild diffuse cortical atrophy. Moderate chronic ischemic white matter disease. No acute intracranial abnormality seen. Electronically Signed   By: Marijo Conception, M.D.   On: 05/09/2016 10:39    Procedures Wound repair Date/Time: 05/09/2016 11:39 PM Performed by: Leo Grosser Authorized by: Leo Grosser  Consent: Verbal consent obtained. Risks and benefits: risks, benefits and alternatives were discussed Consent given by: patient Patient understanding: patient states understanding of the procedure being performed Required items: required blood products, implants, devices, and special equipment available Patient identity confirmed: verbally with patient, arm band and provided demographic data Local anesthesia used:  no  Anesthesia: Local anesthesia used: no Patient tolerance: Patient tolerated the procedure well with no immediate complications Comments: dermabond to several areas of left shoulder, back, left elbow    (including critical care time)  Medications Ordered in ED Medications  silver sulfADIAZINE (SILVADENE) 1 % cream ( Topical Given 05/09/16 1120)     Initial Impression / Assessment and Plan / ED Course  I have reviewed the triage vital signs and the nursing notes.  Pertinent labs & imaging results that were available during my care of the patient were reviewed by me and considered in my medical decision making (see chart for details).  Clinical Course    82 y.o. male presents with large skin tear of left upper arm which has been oozing blood. Recently discontinued xarelto for recurrent falls, family reports he refused evaluation since hitting his head and requests evaluation for ICH although no neuro deficits currently.   This large area on the arm will be treated like a burn d/t its size and risk of infection so coated with silvadene and bandaged. Other smaller areas sealed with dermabond as above. CM consulted for help with wound management evaluation at home as well as mobility help given fall risk. Plan to follow up with PCP as needed and return precautions discussed for worsening or new concerning symptoms.   Final Clinical Impressions(s) / ED Diagnoses   Final diagnoses:  Skin tear of elbow without complication, left, initial encounter  Skin tear of upper arm without complication, left, initial encounter    New Prescriptions Discharge Medication List as of 05/09/2016 11:39 AM       Leo Grosser, MD 05/09/16 2348

## 2016-05-09 NOTE — ED Notes (Signed)
Patient's wounds cleaned with Safeclens and dressed with Xerofoam, gauze and cobain.

## 2016-05-09 NOTE — ED Notes (Signed)
Bed: WA14 Expected date:  Expected time:  Means of arrival:  Comments: EMS 

## 2016-05-09 NOTE — ED Notes (Signed)
Patient has a large skin tear that encompasses the entire upper left posterior of his arm.  He also has several skin tears 2-3 cm on his back.  Patient denies LOC and is alert & oriented.  Patient denies pain except on the skin tear.

## 2016-05-09 NOTE — ED Triage Notes (Addendum)
Per EMS - patient comes from home where he lives alone with c/o skin tear to upper left posterior arm.  Patient also has several skin tears to back.  The left arm was injured when he fell in his garage yesterday around 1600, landing on the legs of a table saw and tearing his skin.  The tears on his back are also from falls that he had earlier this week.  Patient denies hitting head or LOC prior to or after fall and has full recall of event.  Patient's family state patient has hx of orthostatic changes when standing.  Vitals 109/70, HR 54 and irregular (hx of afib), RR 18 and 97% on RA.  Patient was recently taken off Xarelto on Tuesday of this week (reason for d/c unknown).    According to Dr. Carolan Clines notes from office visit, Xarelto was d/c'd because of frequent falls.

## 2016-05-09 NOTE — Care Management Note (Signed)
Case Management Note  Patient Details  Name: Wesley [name] MRN: [id number] Date of Birth: 20-Mar-1921  Subjective/Objective:     Pt with fall from home lives alone Daughter Jacqlyn Larsen (lives in [place]) at bedside states she and his "adopted son" stays with pt throughout the day  Jacqlyn Larsen reports frustration that pt chooses to go out in his garage when they leave him  Jacqlyn Larsen and pt [facility]           Pt agrees to [facility] referrals and prefers Jacqlyn Larsen be called vs him Pt has a walker and cane at home Pt using cane independently without issues while in [facility] ED  Action/Plan: CM received consult from ED unit secretary, Santiago Glad - CM reviewed EPIC notes, face sheet for pt  Cm spoke with daughter while awaiting pt return from restroom with "adopted son", Cristie Hem States pt with some memory issues Cm discussed having her speak with Dr Shelia Media about a neurology consult for progressing memory issues.   CM reviewed in details medicare guidelines, Choices of home health [facility]) (length of stay in home, types of [facility] staff available, coverage, primary caregiver, up to 24 hrs before services may be started) and choices of Private duty nursing (PDN-coverage, length of stay in the home types of staff available). CM provided pt/becky with a list of [place] home health agencies and PDN.   Updated and confirmed contact information for Fannie Knee in [facility]  Updated ED RN Mickel Baas [phone] called home health referral to [facility] of [facility] emphasizing to call Daughter becky first   Expected Discharge Date:       05/09/16           Expected Discharge Plan:    home with home health RN, PT/OT/Aide/SW In-House Referral:   home health   Discharge planning Services   home with home health   Post Acute Care Choice:   home health [facility]  Choice offered to:   pt, daughter becky  DME Arranged:   NA DME Agency:   NA  HH Arranged:    [place] Agency:     Status of Service:     If discussed at [place] of Stay Meetings, dates discussed:    Additional Comments:  Robbie Lis, RN 05/09/2016, 11:41 AM

## 2016-05-12 ENCOUNTER — Other Ambulatory Visit: Payer: Self-pay | Admitting: *Deleted

## 2016-05-12 DIAGNOSIS — I4891 Unspecified atrial fibrillation: Secondary | ICD-10-CM | POA: Diagnosis not present

## 2016-05-12 DIAGNOSIS — I130 Hypertensive heart and chronic kidney disease with heart failure and stage 1 through stage 4 chronic kidney disease, or unspecified chronic kidney disease: Secondary | ICD-10-CM | POA: Diagnosis not present

## 2016-05-12 DIAGNOSIS — Z7982 Long term (current) use of aspirin: Secondary | ICD-10-CM | POA: Diagnosis not present

## 2016-05-12 DIAGNOSIS — Z87891 Personal history of nicotine dependence: Secondary | ICD-10-CM | POA: Diagnosis not present

## 2016-05-12 DIAGNOSIS — I5043 Acute on chronic combined systolic (congestive) and diastolic (congestive) heart failure: Secondary | ICD-10-CM | POA: Diagnosis not present

## 2016-05-12 DIAGNOSIS — Z9181 History of falling: Secondary | ICD-10-CM | POA: Diagnosis not present

## 2016-05-12 DIAGNOSIS — E785 Hyperlipidemia, unspecified: Secondary | ICD-10-CM | POA: Diagnosis not present

## 2016-05-12 DIAGNOSIS — M150 Primary generalized (osteo)arthritis: Secondary | ICD-10-CM | POA: Diagnosis not present

## 2016-05-12 DIAGNOSIS — N183 Chronic kidney disease, stage 3 (moderate): Secondary | ICD-10-CM | POA: Diagnosis not present

## 2016-05-12 DIAGNOSIS — S51809D Unspecified open wound of unspecified forearm, subsequent encounter: Secondary | ICD-10-CM | POA: Diagnosis not present

## 2016-05-12 DIAGNOSIS — I350 Nonrheumatic aortic (valve) stenosis: Secondary | ICD-10-CM | POA: Diagnosis not present

## 2016-05-12 NOTE — Patient Outreach (Signed)
[facility]) Care Management  05/12/2016  Wesley [name] [birth year] [id number]   Referral received from Case Manager at [facility] Emergency department to initiate involvement with member and perform initial assessment to determine needs.  According to referral and notes in chart, member has history of congestive heart failure, atrial fibrillation, hypertension, and dementia.  Over the past few weeks, he has had several falls which has led him to seek medical attention.    Call placed to daughter, [name], as requested per CM note.  No answer, HIPAA compliant voice message left.  Will await call back.  If no call back, will make second attempt tomorrow.  [name], [place], MSN [place] [phone]

## 2016-05-13 ENCOUNTER — Other Ambulatory Visit: Payer: Self-pay | Admitting: *Deleted

## 2016-05-13 DIAGNOSIS — I5043 Acute on chronic combined systolic (congestive) and diastolic (congestive) heart failure: Secondary | ICD-10-CM | POA: Diagnosis not present

## 2016-05-13 DIAGNOSIS — I350 Nonrheumatic aortic (valve) stenosis: Secondary | ICD-10-CM | POA: Diagnosis not present

## 2016-05-13 DIAGNOSIS — Z87891 Personal history of nicotine dependence: Secondary | ICD-10-CM | POA: Diagnosis not present

## 2016-05-13 DIAGNOSIS — N183 Chronic kidney disease, stage 3 (moderate): Secondary | ICD-10-CM | POA: Diagnosis not present

## 2016-05-13 DIAGNOSIS — E785 Hyperlipidemia, unspecified: Secondary | ICD-10-CM | POA: Diagnosis not present

## 2016-05-13 DIAGNOSIS — I130 Hypertensive heart and chronic kidney disease with heart failure and stage 1 through stage 4 chronic kidney disease, or unspecified chronic kidney disease: Secondary | ICD-10-CM | POA: Diagnosis not present

## 2016-05-13 DIAGNOSIS — Z7982 Long term (current) use of aspirin: Secondary | ICD-10-CM | POA: Diagnosis not present

## 2016-05-13 DIAGNOSIS — M150 Primary generalized (osteo)arthritis: Secondary | ICD-10-CM | POA: Diagnosis not present

## 2016-05-13 DIAGNOSIS — S51809D Unspecified open wound of unspecified forearm, subsequent encounter: Secondary | ICD-10-CM | POA: Diagnosis not present

## 2016-05-13 DIAGNOSIS — I4891 Unspecified atrial fibrillation: Secondary | ICD-10-CM | POA: Diagnosis not present

## 2016-05-13 DIAGNOSIS — Z9181 History of falling: Secondary | ICD-10-CM | POA: Diagnosis not present

## 2016-05-13 NOTE — Patient Outreach (Signed)
[facility]) Care Management  05/13/2016  JOEN MILLET 01-Oct-1920 [id number]   2nd attempt made to contact member's caregiver/daughter.  Member's identity verified, [facility] care management services explained.  Daughter states that she is currently a patient at [facility], and is expected to remain for the next couple days.  She has agreed to a home visit with member, scheduled for next week.  She denies any urgent concerns and state that he will be starting home health with [facility].  [name], [place], MSN [place] [phone]

## 2016-05-15 DIAGNOSIS — I5043 Acute on chronic combined systolic (congestive) and diastolic (congestive) heart failure: Secondary | ICD-10-CM | POA: Diagnosis not present

## 2016-05-15 DIAGNOSIS — Z9181 History of falling: Secondary | ICD-10-CM | POA: Diagnosis not present

## 2016-05-15 DIAGNOSIS — Z87891 Personal history of nicotine dependence: Secondary | ICD-10-CM | POA: Diagnosis not present

## 2016-05-15 DIAGNOSIS — I350 Nonrheumatic aortic (valve) stenosis: Secondary | ICD-10-CM | POA: Diagnosis not present

## 2016-05-15 DIAGNOSIS — I130 Hypertensive heart and chronic kidney disease with heart failure and stage 1 through stage 4 chronic kidney disease, or unspecified chronic kidney disease: Secondary | ICD-10-CM | POA: Diagnosis not present

## 2016-05-15 DIAGNOSIS — N183 Chronic kidney disease, stage 3 (moderate): Secondary | ICD-10-CM | POA: Diagnosis not present

## 2016-05-15 DIAGNOSIS — S51809D Unspecified open wound of unspecified forearm, subsequent encounter: Secondary | ICD-10-CM | POA: Diagnosis not present

## 2016-05-15 DIAGNOSIS — Z7982 Long term (current) use of aspirin: Secondary | ICD-10-CM | POA: Diagnosis not present

## 2016-05-15 DIAGNOSIS — I4891 Unspecified atrial fibrillation: Secondary | ICD-10-CM | POA: Diagnosis not present

## 2016-05-15 DIAGNOSIS — M150 Primary generalized (osteo)arthritis: Secondary | ICD-10-CM | POA: Diagnosis not present

## 2016-05-15 DIAGNOSIS — E785 Hyperlipidemia, unspecified: Secondary | ICD-10-CM | POA: Diagnosis not present

## 2016-05-16 DIAGNOSIS — Z9181 History of falling: Secondary | ICD-10-CM | POA: Diagnosis not present

## 2016-05-16 DIAGNOSIS — N183 Chronic kidney disease, stage 3 (moderate): Secondary | ICD-10-CM | POA: Diagnosis not present

## 2016-05-16 DIAGNOSIS — Z87891 Personal history of nicotine dependence: Secondary | ICD-10-CM | POA: Diagnosis not present

## 2016-05-16 DIAGNOSIS — Z7982 Long term (current) use of aspirin: Secondary | ICD-10-CM | POA: Diagnosis not present

## 2016-05-16 DIAGNOSIS — M150 Primary generalized (osteo)arthritis: Secondary | ICD-10-CM | POA: Diagnosis not present

## 2016-05-16 DIAGNOSIS — I130 Hypertensive heart and chronic kidney disease with heart failure and stage 1 through stage 4 chronic kidney disease, or unspecified chronic kidney disease: Secondary | ICD-10-CM | POA: Diagnosis not present

## 2016-05-16 DIAGNOSIS — E785 Hyperlipidemia, unspecified: Secondary | ICD-10-CM | POA: Diagnosis not present

## 2016-05-16 DIAGNOSIS — S51809D Unspecified open wound of unspecified forearm, subsequent encounter: Secondary | ICD-10-CM | POA: Diagnosis not present

## 2016-05-16 DIAGNOSIS — I4891 Unspecified atrial fibrillation: Secondary | ICD-10-CM | POA: Diagnosis not present

## 2016-05-16 DIAGNOSIS — I350 Nonrheumatic aortic (valve) stenosis: Secondary | ICD-10-CM | POA: Diagnosis not present

## 2016-05-16 DIAGNOSIS — I5043 Acute on chronic combined systolic (congestive) and diastolic (congestive) heart failure: Secondary | ICD-10-CM | POA: Diagnosis not present

## 2016-05-20 DIAGNOSIS — S51809D Unspecified open wound of unspecified forearm, subsequent encounter: Secondary | ICD-10-CM | POA: Diagnosis not present

## 2016-05-20 DIAGNOSIS — N183 Chronic kidney disease, stage 3 (moderate): Secondary | ICD-10-CM | POA: Diagnosis not present

## 2016-05-20 DIAGNOSIS — Z9181 History of falling: Secondary | ICD-10-CM | POA: Diagnosis not present

## 2016-05-20 DIAGNOSIS — I350 Nonrheumatic aortic (valve) stenosis: Secondary | ICD-10-CM | POA: Diagnosis not present

## 2016-05-20 DIAGNOSIS — M150 Primary generalized (osteo)arthritis: Secondary | ICD-10-CM | POA: Diagnosis not present

## 2016-05-20 DIAGNOSIS — I5043 Acute on chronic combined systolic (congestive) and diastolic (congestive) heart failure: Secondary | ICD-10-CM | POA: Diagnosis not present

## 2016-05-20 DIAGNOSIS — E785 Hyperlipidemia, unspecified: Secondary | ICD-10-CM | POA: Diagnosis not present

## 2016-05-20 DIAGNOSIS — I130 Hypertensive heart and chronic kidney disease with heart failure and stage 1 through stage 4 chronic kidney disease, or unspecified chronic kidney disease: Secondary | ICD-10-CM | POA: Diagnosis not present

## 2016-05-20 DIAGNOSIS — Z7982 Long term (current) use of aspirin: Secondary | ICD-10-CM | POA: Diagnosis not present

## 2016-05-20 DIAGNOSIS — I4891 Unspecified atrial fibrillation: Secondary | ICD-10-CM | POA: Diagnosis not present

## 2016-05-20 DIAGNOSIS — Z87891 Personal history of nicotine dependence: Secondary | ICD-10-CM | POA: Diagnosis not present

## 2016-05-21 ENCOUNTER — Other Ambulatory Visit: Payer: Self-pay | Admitting: *Deleted

## 2016-05-21 NOTE — Patient Outreach (Signed)
[facility]) Care Management  05/21/2016  Wesley [name] 27-Dec-1920 [id number]   Call placed to daughter, Wesley [name], to confirm home visit for this afternoon.  She states that she was just discharged from the hospital on Friday and have not been able to return to her duties as the [facility] caregiver yet.  However, she state that her brother, Wesley [name] (not blood brother but very close family friend) has been filling in for her.  She places a call to [facility] and confirmed home visit for this afternoon.  This care manager proceeded with home visit, arrived at [name] home at scheduled time.  Doorbell rung several times with no answer.  Call then placed to [name], she state that the member & Wesley [name] went out to the grocery store earlier but was supposed to be back in time for the visit.  She apologizes and reschedules for next week when she is available.    Will proceed next week with home visit.  [name], [place], MSN [place] [phone]

## 2016-05-22 ENCOUNTER — Other Ambulatory Visit: Payer: Self-pay | Admitting: Physician Assistant

## 2016-05-22 DIAGNOSIS — Z9181 History of falling: Secondary | ICD-10-CM | POA: Diagnosis not present

## 2016-05-22 DIAGNOSIS — Z7982 Long term (current) use of aspirin: Secondary | ICD-10-CM | POA: Diagnosis not present

## 2016-05-22 DIAGNOSIS — I4891 Unspecified atrial fibrillation: Secondary | ICD-10-CM | POA: Diagnosis not present

## 2016-05-22 DIAGNOSIS — I5043 Acute on chronic combined systolic (congestive) and diastolic (congestive) heart failure: Secondary | ICD-10-CM | POA: Diagnosis not present

## 2016-05-22 DIAGNOSIS — E785 Hyperlipidemia, unspecified: Secondary | ICD-10-CM | POA: Diagnosis not present

## 2016-05-22 DIAGNOSIS — I350 Nonrheumatic aortic (valve) stenosis: Secondary | ICD-10-CM | POA: Diagnosis not present

## 2016-05-22 DIAGNOSIS — N183 Chronic kidney disease, stage 3 (moderate): Secondary | ICD-10-CM | POA: Diagnosis not present

## 2016-05-22 DIAGNOSIS — M150 Primary generalized (osteo)arthritis: Secondary | ICD-10-CM | POA: Diagnosis not present

## 2016-05-22 DIAGNOSIS — S51809D Unspecified open wound of unspecified forearm, subsequent encounter: Secondary | ICD-10-CM | POA: Diagnosis not present

## 2016-05-22 DIAGNOSIS — Z87891 Personal history of nicotine dependence: Secondary | ICD-10-CM | POA: Diagnosis not present

## 2016-05-22 DIAGNOSIS — I130 Hypertensive heart and chronic kidney disease with heart failure and stage 1 through stage 4 chronic kidney disease, or unspecified chronic kidney disease: Secondary | ICD-10-CM | POA: Diagnosis not present

## 2016-05-22 NOTE — Telephone Encounter (Signed)
Spoke to pt Daughter because received refill request for Xarelto and per last office visit pt was taken off Xarelto.  She states he has been off medication and they do not need refills, but she did want to let Dr. Loletha Grayer know that pt had another fall a few days after his last visit.   His shoulder bled and required a trip to the ED. His bleeding is better now and [facility] is taking care of him at home.  She also wanted Dr. Loletha Grayer to be aware that they scanned his head while admitted and he was not bleeding in the brain from the most recent fall or the previous fall.   Will cancel rx for Xarelto. Daughter states appreciation for call.  Will route to Dr. Loletha Grayer as Juluis Rainier.

## 2016-05-22 NOTE — Telephone Encounter (Signed)
Thank you :)

## 2016-05-27 DIAGNOSIS — S51809D Unspecified open wound of unspecified forearm, subsequent encounter: Secondary | ICD-10-CM | POA: Diagnosis not present

## 2016-05-27 DIAGNOSIS — I350 Nonrheumatic aortic (valve) stenosis: Secondary | ICD-10-CM | POA: Diagnosis not present

## 2016-05-27 DIAGNOSIS — Z79899 Other long term (current) drug therapy: Secondary | ICD-10-CM | POA: Diagnosis not present

## 2016-05-27 DIAGNOSIS — Z87891 Personal history of nicotine dependence: Secondary | ICD-10-CM | POA: Diagnosis not present

## 2016-05-27 DIAGNOSIS — I4891 Unspecified atrial fibrillation: Secondary | ICD-10-CM | POA: Diagnosis not present

## 2016-05-27 DIAGNOSIS — N183 Chronic kidney disease, stage 3 (moderate): Secondary | ICD-10-CM | POA: Diagnosis not present

## 2016-05-27 DIAGNOSIS — Z9181 History of falling: Secondary | ICD-10-CM | POA: Diagnosis not present

## 2016-05-27 DIAGNOSIS — E785 Hyperlipidemia, unspecified: Secondary | ICD-10-CM | POA: Diagnosis not present

## 2016-05-27 DIAGNOSIS — I5043 Acute on chronic combined systolic (congestive) and diastolic (congestive) heart failure: Secondary | ICD-10-CM | POA: Diagnosis not present

## 2016-05-27 DIAGNOSIS — Z7982 Long term (current) use of aspirin: Secondary | ICD-10-CM | POA: Diagnosis not present

## 2016-05-27 DIAGNOSIS — M150 Primary generalized (osteo)arthritis: Secondary | ICD-10-CM | POA: Diagnosis not present

## 2016-05-27 DIAGNOSIS — I5032 Chronic diastolic (congestive) heart failure: Secondary | ICD-10-CM | POA: Diagnosis not present

## 2016-05-27 DIAGNOSIS — I130 Hypertensive heart and chronic kidney disease with heart failure and stage 1 through stage 4 chronic kidney disease, or unspecified chronic kidney disease: Secondary | ICD-10-CM | POA: Diagnosis not present

## 2016-05-28 ENCOUNTER — Other Ambulatory Visit: Payer: Self-pay | Admitting: *Deleted

## 2016-05-28 ENCOUNTER — Encounter: Payer: Self-pay | Admitting: *Deleted

## 2016-05-28 DIAGNOSIS — I5043 Acute on chronic combined systolic (congestive) and diastolic (congestive) heart failure: Secondary | ICD-10-CM | POA: Diagnosis not present

## 2016-05-28 DIAGNOSIS — S51809D Unspecified open wound of unspecified forearm, subsequent encounter: Secondary | ICD-10-CM | POA: Diagnosis not present

## 2016-05-28 DIAGNOSIS — E785 Hyperlipidemia, unspecified: Secondary | ICD-10-CM | POA: Diagnosis not present

## 2016-05-28 DIAGNOSIS — Z87891 Personal history of nicotine dependence: Secondary | ICD-10-CM | POA: Diagnosis not present

## 2016-05-28 DIAGNOSIS — I130 Hypertensive heart and chronic kidney disease with heart failure and stage 1 through stage 4 chronic kidney disease, or unspecified chronic kidney disease: Secondary | ICD-10-CM | POA: Diagnosis not present

## 2016-05-28 DIAGNOSIS — N183 Chronic kidney disease, stage 3 (moderate): Secondary | ICD-10-CM | POA: Diagnosis not present

## 2016-05-28 DIAGNOSIS — I350 Nonrheumatic aortic (valve) stenosis: Secondary | ICD-10-CM | POA: Diagnosis not present

## 2016-05-28 DIAGNOSIS — I4891 Unspecified atrial fibrillation: Secondary | ICD-10-CM | POA: Diagnosis not present

## 2016-05-28 DIAGNOSIS — M150 Primary generalized (osteo)arthritis: Secondary | ICD-10-CM | POA: Diagnosis not present

## 2016-05-28 DIAGNOSIS — Z9181 History of falling: Secondary | ICD-10-CM | POA: Diagnosis not present

## 2016-05-28 DIAGNOSIS — Z7982 Long term (current) use of aspirin: Secondary | ICD-10-CM | POA: Diagnosis not present

## 2016-05-28 LAB — BASIC METABOLIC PANEL
BUN: 70 mg/dL — ABNORMAL HIGH (ref 7–25)
CHLORIDE: 91 mmol/L — AB (ref 98–110)
CO2: 33 mmol/L — ABNORMAL HIGH (ref 20–31)
Calcium: 9.5 mg/dL (ref 8.6–10.3)
Creat: 2.08 mg/dL — ABNORMAL HIGH (ref 0.70–1.11)
Glucose, Bld: 116 mg/dL — ABNORMAL HIGH (ref 65–99)
POTASSIUM: 4.2 mmol/L (ref 3.5–5.3)
SODIUM: 138 mmol/L (ref 135–146)

## 2016-05-28 NOTE — Patient Outreach (Signed)
[facility]) Care Management  05/28/2016  Wesley [name] Sep 07, 1921 [id number]   Met with member and daughter at scheduled time.  [facility] care management services explained.  Made aware that referral was received from [facility] Emergency Department due to concern for frequent falls.  They both report that the member has not had any falls since his last ED visit.  He now has home health PT & OT.  Although the member lives alone, his daughter and close family friend spend several hours a day with the member.    The member and daughter both state that there are no further concerns or needs for [facility] (including Education officer, museum and pharmacy assistance).  Daughter state that the member is "stubborn" and will not accept any additional help, nor will he consider assisted living as another option.  Daughter provides all meals and manages medications.    Contact information provided, advised to contact if needs change.  Will notify care management assistant and PCP of case closure.  [name], [place], MSN [place] [phone]

## 2016-05-29 DIAGNOSIS — I130 Hypertensive heart and chronic kidney disease with heart failure and stage 1 through stage 4 chronic kidney disease, or unspecified chronic kidney disease: Secondary | ICD-10-CM | POA: Diagnosis not present

## 2016-05-29 DIAGNOSIS — Z7982 Long term (current) use of aspirin: Secondary | ICD-10-CM | POA: Diagnosis not present

## 2016-05-29 DIAGNOSIS — S51809D Unspecified open wound of unspecified forearm, subsequent encounter: Secondary | ICD-10-CM | POA: Diagnosis not present

## 2016-05-29 DIAGNOSIS — Z9181 History of falling: Secondary | ICD-10-CM | POA: Diagnosis not present

## 2016-05-29 DIAGNOSIS — N183 Chronic kidney disease, stage 3 (moderate): Secondary | ICD-10-CM | POA: Diagnosis not present

## 2016-05-29 DIAGNOSIS — I4891 Unspecified atrial fibrillation: Secondary | ICD-10-CM | POA: Diagnosis not present

## 2016-05-29 DIAGNOSIS — Z87891 Personal history of nicotine dependence: Secondary | ICD-10-CM | POA: Diagnosis not present

## 2016-05-29 DIAGNOSIS — E785 Hyperlipidemia, unspecified: Secondary | ICD-10-CM | POA: Diagnosis not present

## 2016-05-29 DIAGNOSIS — I350 Nonrheumatic aortic (valve) stenosis: Secondary | ICD-10-CM | POA: Diagnosis not present

## 2016-05-29 DIAGNOSIS — I5043 Acute on chronic combined systolic (congestive) and diastolic (congestive) heart failure: Secondary | ICD-10-CM | POA: Diagnosis not present

## 2016-05-29 DIAGNOSIS — M150 Primary generalized (osteo)arthritis: Secondary | ICD-10-CM | POA: Diagnosis not present

## 2016-05-30 ENCOUNTER — Encounter: Payer: Self-pay | Admitting: Cardiovascular Disease

## 2016-05-30 ENCOUNTER — Ambulatory Visit (INDEPENDENT_AMBULATORY_CARE_PROVIDER_SITE_OTHER): Payer: Medicare Other | Admitting: Cardiovascular Disease

## 2016-05-30 VITALS — BP 132/82 | HR 60 | Ht 66.0 in | Wt 208.8 lb

## 2016-05-30 DIAGNOSIS — I5043 Acute on chronic combined systolic (congestive) and diastolic (congestive) heart failure: Secondary | ICD-10-CM | POA: Diagnosis not present

## 2016-05-30 DIAGNOSIS — I350 Nonrheumatic aortic (valve) stenosis: Secondary | ICD-10-CM

## 2016-05-30 DIAGNOSIS — N183 Chronic kidney disease, stage 3 unspecified: Secondary | ICD-10-CM

## 2016-05-30 DIAGNOSIS — I10 Essential (primary) hypertension: Secondary | ICD-10-CM | POA: Diagnosis not present

## 2016-05-30 DIAGNOSIS — N189 Chronic kidney disease, unspecified: Secondary | ICD-10-CM

## 2016-05-30 DIAGNOSIS — I4819 Other persistent atrial fibrillation: Secondary | ICD-10-CM

## 2016-05-30 DIAGNOSIS — I481 Persistent atrial fibrillation: Secondary | ICD-10-CM | POA: Diagnosis not present

## 2016-05-30 NOTE — Patient Instructions (Signed)
Your physician recommends that you return for lab work in 40 month.  Dr Sallyanne Kuster recommends that you schedule a follow-up appointment in 3 months.  If you need a refill on your cardiac medications before your next appointment, please call your pharmacy.

## 2016-05-30 NOTE — Progress Notes (Signed)
Patient ID: Wesley [name], male   DOB: 11/16/1920, 82 y.o.   MRN: [id number] Patient ID: Wesley [name], male   DOB: 1920/11/02, 82 y.o.   MRN: [id number]    Cardiology Office Note    Date:  05/30/2016   ID:  Wesley [name], DOB August 26, 1921, MRN [id number]  PCP:  Horatio Pel, MD  Cardiologist:   Sanda Klein, MD   Chief Complaint  Patient presents with  . Follow-up    History of Present Illness:  Wesley [name] is a 82 y.o. 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 Despite atrial fibrillation, anticoagulants have been stopped due to very frequent and dangerous falls  We seem to have finally found a diuretic regimen that keeps him even. He is taking metolazone twice weekly and his weight has hovered right around 202 pounds on his home scale, never less than 200 pounds. His most recent labs show a creatinine of 2.0, at baseline in a normal potassium level. He denies dyspnea and his edema is mild.  He has fallen twice more since his last appointment, when we discontinued his anticoagulant  He seems to require metolazone a minimum of twice weekly, sometimes a third dose.  He has severe left ventricular hypertrophy by echo but no significant hyper voltage on his electrocardiogram, raising the possibility of cardiac amyloidosis. His nuclear stress test did not raise show coronary insufficiency. His echocardiogram performed in February 2017 showed a left ventricular ejection fraction of 40-45%, comparable to the ejection fraction estimated by nuclear scintigraphy in October 2016 at 46%. The echo again confirmed that his aortic stenosis is very mild. The left atrium is severely dilated. The Doppler parameters suggest that he was volume overloaded (E/e'=14). The systolic PA pressure was estimated at approximately 50  mmHg.    Past Medical History:  Diagnosis Date  . Arthritis    oa  . Chronic diastolic heart failure ([place]) 05/07/2015  . Essential hypertension 05/07/2015  . Family history of adverse reaction to anesthesia    daughter has difficulty waking   . Hyperlipidemia 05/07/2015  . Hypertension     Past Surgical History:  Procedure Laterality Date  . lower back    . TOTAL KNEE ARTHROPLASTY Bilateral     Outpatient Medications Prior to Visit  Medication Sig Dispense Refill  . amLODipine (NORVASC) 2.5 MG tablet Take 1 tablet (2.5 mg total) by mouth daily. 30 tablet 11  . Artificial Tear Ointment (REFRESH P.M. OP) Place 1 application into both eyes at bedtime as needed (dry eyes).    Marland Kitchen aspirin EC 81 MG tablet Take 1 tablet (81 mg total) by mouth daily. 90 tablet 3  . brimonidine (ALPHAGAN P) 0.1 % SOLN Place 1 drop into both eyes daily.    . Carboxymethylcellulose Sodium (THERATEARS OP) Place 1 drop into both eyes 5 (five) times daily as needed (for dry eyes (in between Alphagan and travatan)).     Marland Kitchen Cholecalciferol (VITAMIN D3) 5000 units TABS Take 5,000 Units by mouth daily.    . furosemide (LASIX) 40 MG tablet TAKE 2 TABLETS (80 MG TOTAL) BY MOUTH 2 (TWO) TIMES DAILY. (Patient taking differently: TAKE 2 TABLETS (80 MG TOTAL) BY MOUTH once daily) 90 tablet 3  . KLOR-CON M10 10 MEQ tablet Take 2-4 tablets (20-40 mEq total) by mouth twice daily. May take additional tablets on Wednesdays  and Saturdays, Mondays as needed. (Patient taking differently: Take 10-20 mEq by mouth as directed. Take 61meq by mouth twice daily. On days pt takes metolazone pt take 67meq in the morning and then 25meq in the evening for a total of 40 meq throughout the day. (afternoon meds are given around 1400)) 240 tablet 11  . metolazone (ZAROXOLYN) 5 MG tablet Take 1 tablet (5 mg total) by mouth on Wednesdays and Saturdays. May take an extra tablet on Mondays if weight is 205 pounds or more. (Patient taking differently: Take 5  mg by mouth as directed. Take 1 tablet (5 mg total) by mouth on Wednesdays and Saturdays. And May take an extra tablet on Mondays if needed for when weight is 205 pounds or more.) 30 tablet 5  . Probiotic Product (PROBIOTIC PO) Take 1 tablet by mouth daily as needed (for stomach irritation).     . Tamsulosin HCl (FLOMAX) 0.4 MG CAPS Take 0.4 mg by mouth daily.     . Travoprost, BAK Free, (TRAVATAN) 0.004 % SOLN ophthalmic solution Place 1 drop into both eyes at bedtime.    . vitamin C (ASCORBIC ACID) 500 MG tablet Take 500 mg by mouth daily.     No facility-administered medications prior to visit.      Allergies:   Neosporin [neomycin-bacitracin zn-polymyx] and Penicillins   Social History   Social History  . Marital status: Widowed    Spouse name: N/A  . Number of children: N/A  . Years of education: N/A   Social History Main Topics  . Smoking status: Former Research scientist (life sciences)  . Smokeless tobacco: Never Used  . Alcohol use No  . Drug use: No  . Sexual activity: Not Asked   Other Topics Concern  . None   Social History Narrative  . None     Family History:  The patient's family history includes Diabetes in his son; Heart Problems in his mother; Hypertension in his daughter; Kidney disease in his sister; Stroke in his father.   ROS:   Please see the history of present illness.    ROS All other systems reviewed and are negative.   PHYSICAL EXAM:   VS:  BP 132/82 (BP Location: Right Arm, Patient Position: Sitting, Cuff Size: Normal)   Pulse 60   Ht 5\' 6"  (1.676 m)   Wt 208 lb 12.8 oz (94.7 kg)   SpO2 97%   BMI 33.70 kg/m    GEN: Well nourished, well developed, in no acute distress  HEENT: normal  Neck: no JVD, carotid bruits, or masses Cardiac: irregular; 2/6 early peaking right upper sternal border systolic ejection murmur, no diastolic murmurs, rubs, or gallops, symmetrical 2+ symmetrical pitting calf and ankle edema  Respiratory:  clear to auscultation bilaterally, normal  work of breathing GI: soft, nontender, nondistended, + BS MS: no deformity or atrophy  Skin: warm and dry, no rash Neuro:  Alert and Oriented x 3, Strength and sensation are intact Psych: euthymic mood, full affect  Wt Readings from Last 3 Encounters:  05/30/16 208 lb 12.8 oz (94.7 kg)  05/07/16 210 lb (95.3 kg)  03/14/16 211 lb 9.6 oz (96 kg)      Studies/Labs Reviewed:   EKG:  EKG is not ordered today.    Recent Labs: 11/02/2015: B Natriuretic Peptide 397.6 01/30/2016: TSH 3.04 04/09/2016: ALT 17; Magnesium 2.6 05/09/2016: Hemoglobin 12.8; Platelets 189 05/27/2016: BUN 70; Creat 2.08; Potassium 4.2; Sodium 138     ASSESSMENT:    1. Acute on  chronic combined systolic (congestive) and diastolic (congestive) heart failure (HCC)   2. Persistent atrial fibrillation ([place])   3. Aortic stenosis, mild   4. Essential hypertension   5. Chronic renal insufficiency, stage III (moderate)      PLAN:  In order of problems listed above:  1. CHF: He seems to have primarily diastolic dysfunction, possible restrictive/infiltrative cardiomyopathy in this elderly man with severe left atrial dilatation and relatively low voltage on ECG, possible amyloidosis. He Appears to have reached a balanced volume status on furosemide 80 mg daily and metolazone twice weekly on Wednesdays and Saturdays (has not needed  A third dose on Mondays. Continue at least monthly metabolic panels. Reviewed daily weights, signs and symptoms of heart failure exacerbation, sodium restriction. 2. AFib: Likely a permanent arrhythmia in view of the severe left atrial dilatation and his advanced age. He has spontaneously controlled ventricular rate. However, in view of his frequent falls, advanced age and overall frailty, the anticoagulant seems to now be more of a liability then potential benefit.  3. AS: this does not appear to be hemodynamically significant or contributing to heart failure 4. HTN: Blood pressure is still well  controlled after we reduced amlodipine to 2.5 mg daily 5. CKD stage 3-4: Avoiding RAAS inhibitors and spironolactone due to recent hyperkalemia and rather volatile renal function. Monitor renal function carefully during periods of increased diuresis. Seems to have a baseline creatinine of around 2.0  Medication Adjustments/Labs and Tests Ordered: Current medicines are reviewed at length with the patient today.  Concerns regarding medicines are outlined above.  Medication changes, Labs and Tests ordered today are listed in the Patient Instructions below. There are no Patient Instructions on file for this visit. Signed, Sanda Klein, MD  05/30/2016 3:03 PM    [facility] [place], [place], [place]  [address] Phone: [phone]; Fax: [phone]   Patient ID: Wesley [name], male   DOB: December 24, 1920, 82 y.o.   MRN: [id number]

## 2016-06-04 ENCOUNTER — Other Ambulatory Visit: Payer: Self-pay | Admitting: Physician Assistant

## 2016-06-05 DIAGNOSIS — Z7982 Long term (current) use of aspirin: Secondary | ICD-10-CM | POA: Diagnosis not present

## 2016-06-05 DIAGNOSIS — I4891 Unspecified atrial fibrillation: Secondary | ICD-10-CM | POA: Diagnosis not present

## 2016-06-05 DIAGNOSIS — I350 Nonrheumatic aortic (valve) stenosis: Secondary | ICD-10-CM | POA: Diagnosis not present

## 2016-06-05 DIAGNOSIS — S51809D Unspecified open wound of unspecified forearm, subsequent encounter: Secondary | ICD-10-CM | POA: Diagnosis not present

## 2016-06-05 DIAGNOSIS — Z9181 History of falling: Secondary | ICD-10-CM | POA: Diagnosis not present

## 2016-06-05 DIAGNOSIS — I5043 Acute on chronic combined systolic (congestive) and diastolic (congestive) heart failure: Secondary | ICD-10-CM | POA: Diagnosis not present

## 2016-06-05 DIAGNOSIS — Z87891 Personal history of nicotine dependence: Secondary | ICD-10-CM | POA: Diagnosis not present

## 2016-06-05 DIAGNOSIS — N183 Chronic kidney disease, stage 3 (moderate): Secondary | ICD-10-CM | POA: Diagnosis not present

## 2016-06-05 DIAGNOSIS — E785 Hyperlipidemia, unspecified: Secondary | ICD-10-CM | POA: Diagnosis not present

## 2016-06-05 DIAGNOSIS — I130 Hypertensive heart and chronic kidney disease with heart failure and stage 1 through stage 4 chronic kidney disease, or unspecified chronic kidney disease: Secondary | ICD-10-CM | POA: Diagnosis not present

## 2016-06-05 DIAGNOSIS — M150 Primary generalized (osteo)arthritis: Secondary | ICD-10-CM | POA: Diagnosis not present

## 2016-06-06 DIAGNOSIS — S51809D Unspecified open wound of unspecified forearm, subsequent encounter: Secondary | ICD-10-CM | POA: Diagnosis not present

## 2016-06-06 DIAGNOSIS — Z7982 Long term (current) use of aspirin: Secondary | ICD-10-CM | POA: Diagnosis not present

## 2016-06-06 DIAGNOSIS — Z87891 Personal history of nicotine dependence: Secondary | ICD-10-CM | POA: Diagnosis not present

## 2016-06-06 DIAGNOSIS — N183 Chronic kidney disease, stage 3 (moderate): Secondary | ICD-10-CM | POA: Diagnosis not present

## 2016-06-06 DIAGNOSIS — Z9181 History of falling: Secondary | ICD-10-CM | POA: Diagnosis not present

## 2016-06-06 DIAGNOSIS — E785 Hyperlipidemia, unspecified: Secondary | ICD-10-CM | POA: Diagnosis not present

## 2016-06-06 DIAGNOSIS — I130 Hypertensive heart and chronic kidney disease with heart failure and stage 1 through stage 4 chronic kidney disease, or unspecified chronic kidney disease: Secondary | ICD-10-CM | POA: Diagnosis not present

## 2016-06-06 DIAGNOSIS — I350 Nonrheumatic aortic (valve) stenosis: Secondary | ICD-10-CM | POA: Diagnosis not present

## 2016-06-06 DIAGNOSIS — I4891 Unspecified atrial fibrillation: Secondary | ICD-10-CM | POA: Diagnosis not present

## 2016-06-06 DIAGNOSIS — M150 Primary generalized (osteo)arthritis: Secondary | ICD-10-CM | POA: Diagnosis not present

## 2016-06-06 DIAGNOSIS — I5043 Acute on chronic combined systolic (congestive) and diastolic (congestive) heart failure: Secondary | ICD-10-CM | POA: Diagnosis not present

## 2016-06-10 DIAGNOSIS — M150 Primary generalized (osteo)arthritis: Secondary | ICD-10-CM | POA: Diagnosis not present

## 2016-06-10 DIAGNOSIS — N183 Chronic kidney disease, stage 3 (moderate): Secondary | ICD-10-CM | POA: Diagnosis not present

## 2016-06-10 DIAGNOSIS — I5043 Acute on chronic combined systolic (congestive) and diastolic (congestive) heart failure: Secondary | ICD-10-CM | POA: Diagnosis not present

## 2016-06-10 DIAGNOSIS — Z87891 Personal history of nicotine dependence: Secondary | ICD-10-CM | POA: Diagnosis not present

## 2016-06-10 DIAGNOSIS — Z9181 History of falling: Secondary | ICD-10-CM | POA: Diagnosis not present

## 2016-06-10 DIAGNOSIS — Z7982 Long term (current) use of aspirin: Secondary | ICD-10-CM | POA: Diagnosis not present

## 2016-06-10 DIAGNOSIS — I350 Nonrheumatic aortic (valve) stenosis: Secondary | ICD-10-CM | POA: Diagnosis not present

## 2016-06-10 DIAGNOSIS — I130 Hypertensive heart and chronic kidney disease with heart failure and stage 1 through stage 4 chronic kidney disease, or unspecified chronic kidney disease: Secondary | ICD-10-CM | POA: Diagnosis not present

## 2016-06-10 DIAGNOSIS — E785 Hyperlipidemia, unspecified: Secondary | ICD-10-CM | POA: Diagnosis not present

## 2016-06-10 DIAGNOSIS — I4891 Unspecified atrial fibrillation: Secondary | ICD-10-CM | POA: Diagnosis not present

## 2016-06-10 DIAGNOSIS — S51809D Unspecified open wound of unspecified forearm, subsequent encounter: Secondary | ICD-10-CM | POA: Diagnosis not present

## 2016-06-12 DIAGNOSIS — E785 Hyperlipidemia, unspecified: Secondary | ICD-10-CM | POA: Diagnosis not present

## 2016-06-12 DIAGNOSIS — N183 Chronic kidney disease, stage 3 (moderate): Secondary | ICD-10-CM | POA: Diagnosis not present

## 2016-06-12 DIAGNOSIS — Z9181 History of falling: Secondary | ICD-10-CM | POA: Diagnosis not present

## 2016-06-12 DIAGNOSIS — I4891 Unspecified atrial fibrillation: Secondary | ICD-10-CM | POA: Diagnosis not present

## 2016-06-12 DIAGNOSIS — I350 Nonrheumatic aortic (valve) stenosis: Secondary | ICD-10-CM | POA: Diagnosis not present

## 2016-06-12 DIAGNOSIS — M150 Primary generalized (osteo)arthritis: Secondary | ICD-10-CM | POA: Diagnosis not present

## 2016-06-12 DIAGNOSIS — I130 Hypertensive heart and chronic kidney disease with heart failure and stage 1 through stage 4 chronic kidney disease, or unspecified chronic kidney disease: Secondary | ICD-10-CM | POA: Diagnosis not present

## 2016-06-12 DIAGNOSIS — I5043 Acute on chronic combined systolic (congestive) and diastolic (congestive) heart failure: Secondary | ICD-10-CM | POA: Diagnosis not present

## 2016-06-12 DIAGNOSIS — Z87891 Personal history of nicotine dependence: Secondary | ICD-10-CM | POA: Diagnosis not present

## 2016-06-12 DIAGNOSIS — Z7982 Long term (current) use of aspirin: Secondary | ICD-10-CM | POA: Diagnosis not present

## 2016-06-12 DIAGNOSIS — S51809D Unspecified open wound of unspecified forearm, subsequent encounter: Secondary | ICD-10-CM | POA: Diagnosis not present

## 2016-06-13 DIAGNOSIS — I4891 Unspecified atrial fibrillation: Secondary | ICD-10-CM | POA: Diagnosis not present

## 2016-06-13 DIAGNOSIS — M150 Primary generalized (osteo)arthritis: Secondary | ICD-10-CM | POA: Diagnosis not present

## 2016-06-13 DIAGNOSIS — Z9181 History of falling: Secondary | ICD-10-CM | POA: Diagnosis not present

## 2016-06-13 DIAGNOSIS — Z08 Encounter for follow-up examination after completed treatment for malignant neoplasm: Secondary | ICD-10-CM | POA: Diagnosis not present

## 2016-06-13 DIAGNOSIS — Z85828 Personal history of other malignant neoplasm of skin: Secondary | ICD-10-CM | POA: Diagnosis not present

## 2016-06-13 DIAGNOSIS — Z87891 Personal history of nicotine dependence: Secondary | ICD-10-CM | POA: Diagnosis not present

## 2016-06-13 DIAGNOSIS — E785 Hyperlipidemia, unspecified: Secondary | ICD-10-CM | POA: Diagnosis not present

## 2016-06-13 DIAGNOSIS — Z7982 Long term (current) use of aspirin: Secondary | ICD-10-CM | POA: Diagnosis not present

## 2016-06-13 DIAGNOSIS — I130 Hypertensive heart and chronic kidney disease with heart failure and stage 1 through stage 4 chronic kidney disease, or unspecified chronic kidney disease: Secondary | ICD-10-CM | POA: Diagnosis not present

## 2016-06-13 DIAGNOSIS — I5043 Acute on chronic combined systolic (congestive) and diastolic (congestive) heart failure: Secondary | ICD-10-CM | POA: Diagnosis not present

## 2016-06-13 DIAGNOSIS — S51809D Unspecified open wound of unspecified forearm, subsequent encounter: Secondary | ICD-10-CM | POA: Diagnosis not present

## 2016-06-13 DIAGNOSIS — C44629 Squamous cell carcinoma of skin of left upper limb, including shoulder: Secondary | ICD-10-CM | POA: Diagnosis not present

## 2016-06-13 DIAGNOSIS — I350 Nonrheumatic aortic (valve) stenosis: Secondary | ICD-10-CM | POA: Diagnosis not present

## 2016-06-13 DIAGNOSIS — N183 Chronic kidney disease, stage 3 (moderate): Secondary | ICD-10-CM | POA: Diagnosis not present

## 2016-06-19 DIAGNOSIS — N183 Chronic kidney disease, stage 3 (moderate): Secondary | ICD-10-CM | POA: Diagnosis not present

## 2016-06-19 DIAGNOSIS — D631 Anemia in chronic kidney disease: Secondary | ICD-10-CM | POA: Diagnosis not present

## 2016-06-23 DIAGNOSIS — Z23 Encounter for immunization: Secondary | ICD-10-CM | POA: Diagnosis not present

## 2016-06-23 DIAGNOSIS — I129 Hypertensive chronic kidney disease with stage 1 through stage 4 chronic kidney disease, or unspecified chronic kidney disease: Secondary | ICD-10-CM | POA: Diagnosis not present

## 2016-06-23 DIAGNOSIS — N2581 Secondary hyperparathyroidism of renal origin: Secondary | ICD-10-CM | POA: Diagnosis not present

## 2016-06-23 DIAGNOSIS — N183 Chronic kidney disease, stage 3 (moderate): Secondary | ICD-10-CM | POA: Diagnosis not present

## 2016-06-23 DIAGNOSIS — D631 Anemia in chronic kidney disease: Secondary | ICD-10-CM | POA: Diagnosis not present

## 2016-06-26 ENCOUNTER — Other Ambulatory Visit (HOSPITAL_COMMUNITY): Payer: Self-pay | Admitting: *Deleted

## 2016-06-27 ENCOUNTER — Telehealth: Payer: Self-pay | Admitting: Cardiovascular Disease

## 2016-06-27 DIAGNOSIS — C44629 Squamous cell carcinoma of skin of left upper limb, including shoulder: Secondary | ICD-10-CM | POA: Diagnosis not present

## 2016-06-27 NOTE — Telephone Encounter (Signed)
Records received from [facility] for apt on 09/08/16 with Dr Sallyanne Kuster. Records given to [employer] (medical records) CN

## 2016-06-29 ENCOUNTER — Other Ambulatory Visit: Payer: Self-pay | Admitting: Cardiovascular Disease

## 2016-06-30 ENCOUNTER — Encounter (HOSPITAL_COMMUNITY)
Admission: RE | Admit: 2016-06-30 | Discharge: 2016-06-30 | Disposition: A | Discharge status: Home / Self Care | Payer: Medicare Other | Source: Ambulatory Visit | Attending: Nephrology | Admitting: Nephrology

## 2016-06-30 DIAGNOSIS — D509 Iron deficiency anemia, unspecified: Secondary | ICD-10-CM | POA: Insufficient documentation

## 2016-06-30 MED ORDER — FERUMOXYTOL INJECTION 510 MG/17 ML
510.0000 mg | INTRAVENOUS | Status: DC
  Administered 2016-06-30: 510 mg via INTRAVENOUS
  Filled 2016-06-30: qty 17

## 2016-06-30 MED ORDER — SODIUM CHLORIDE 0.9 % IV SOLN
INTRAVENOUS | Status: DC
  Administered 2016-06-30: 10:00:00 via INTRAVENOUS

## 2016-06-30 NOTE — Discharge Instructions (Signed)

## 2016-06-30 NOTE — Telephone Encounter (Signed)
Rx request sent to pharmacy.  

## 2016-07-01 DIAGNOSIS — S51809D Unspecified open wound of unspecified forearm, subsequent encounter: Secondary | ICD-10-CM | POA: Diagnosis not present

## 2016-07-01 DIAGNOSIS — I4891 Unspecified atrial fibrillation: Secondary | ICD-10-CM | POA: Diagnosis not present

## 2016-07-01 DIAGNOSIS — I5043 Acute on chronic combined systolic (congestive) and diastolic (congestive) heart failure: Secondary | ICD-10-CM | POA: Diagnosis not present

## 2016-07-01 DIAGNOSIS — I130 Hypertensive heart and chronic kidney disease with heart failure and stage 1 through stage 4 chronic kidney disease, or unspecified chronic kidney disease: Secondary | ICD-10-CM | POA: Diagnosis not present

## 2016-07-07 ENCOUNTER — Ambulatory Visit (HOSPITAL_COMMUNITY)
Admission: RE | Admit: 2016-07-07 | Discharge: 2016-07-07 | Disposition: A | Discharge status: Home / Self Care | Payer: Medicare Other | Source: Ambulatory Visit | Attending: Nephrology | Admitting: Nephrology

## 2016-07-07 DIAGNOSIS — D509 Iron deficiency anemia, unspecified: Secondary | ICD-10-CM | POA: Insufficient documentation

## 2016-07-07 MED ORDER — SODIUM CHLORIDE 0.9 % IV SOLN
510.0000 mg | INTRAVENOUS | Status: DC
  Administered 2016-07-07: 510 mg via INTRAVENOUS
  Filled 2016-07-07: qty 17

## 2016-07-07 MED ORDER — SODIUM CHLORIDE 0.9 % IV SOLN: INTRAVENOUS | Status: DC

## 2016-07-18 ENCOUNTER — Telehealth: Payer: Self-pay | Admitting: Cardiovascular Disease

## 2016-07-18 NOTE — Telephone Encounter (Signed)
Pt 's dtr Wesley [name] calling to see if labs are needed for his Dec visit, pls send order if so

## 2016-07-18 NOTE — Telephone Encounter (Signed)
Spoke with pt dtr, she reports dr patel has seen the patient and he was recently sent to the hospital for iron infusions. Will get those records and show them to dr croitoru and then let the dtr know what he thinks. Pt dtr agreed with this plan.

## 2016-07-23 NOTE — Telephone Encounter (Signed)
Called daughter last PM. Per [facility], patient does not need further labs prior to appointment in December. Daughter will keep an eye on him and call if he has any worsening issues with breathing or swelling.

## 2016-07-24 ENCOUNTER — Telehealth: Payer: Self-pay

## 2016-07-24 NOTE — Telephone Encounter (Signed)
Faxed statement of medical necessity for folding walker to [facility] with [facility].

## 2016-07-25 DIAGNOSIS — I5043 Acute on chronic combined systolic (congestive) and diastolic (congestive) heart failure: Secondary | ICD-10-CM | POA: Diagnosis not present

## 2016-07-25 DIAGNOSIS — Z9181 History of falling: Secondary | ICD-10-CM | POA: Diagnosis not present

## 2016-07-25 DIAGNOSIS — N183 Chronic kidney disease, stage 3 (moderate): Secondary | ICD-10-CM | POA: Diagnosis not present

## 2016-07-25 DIAGNOSIS — I130 Hypertensive heart and chronic kidney disease with heart failure and stage 1 through stage 4 chronic kidney disease, or unspecified chronic kidney disease: Secondary | ICD-10-CM | POA: Diagnosis not present

## 2016-08-14 DIAGNOSIS — H401122 Primary open-angle glaucoma, left eye, moderate stage: Secondary | ICD-10-CM | POA: Diagnosis not present

## 2016-08-14 DIAGNOSIS — H401112 Primary open-angle glaucoma, right eye, moderate stage: Secondary | ICD-10-CM | POA: Diagnosis not present

## 2016-09-04 DIAGNOSIS — I5043 Acute on chronic combined systolic (congestive) and diastolic (congestive) heart failure: Secondary | ICD-10-CM | POA: Diagnosis not present

## 2016-09-05 LAB — BASIC METABOLIC PANEL
BUN: 76 mg/dL — ABNORMAL HIGH (ref 7–25)
CALCIUM: 9.2 mg/dL (ref 8.6–10.3)
CO2: 35 mmol/L — ABNORMAL HIGH (ref 20–31)
CREATININE: 2.2 mg/dL — AB (ref 0.70–1.11)
Chloride: 92 mmol/L — ABNORMAL LOW (ref 98–110)
GLUCOSE: 78 mg/dL (ref 65–99)
Potassium: 3.9 mmol/L (ref 3.5–5.3)
SODIUM: 139 mmol/L (ref 135–146)

## 2016-09-08 ENCOUNTER — Encounter: Payer: Self-pay | Admitting: Cardiovascular Disease

## 2016-09-08 ENCOUNTER — Ambulatory Visit (INDEPENDENT_AMBULATORY_CARE_PROVIDER_SITE_OTHER): Payer: Medicare Other | Admitting: Cardiovascular Disease

## 2016-09-08 VITALS — BP 108/60 | HR 74 | Ht 66.0 in | Wt 209.6 lb

## 2016-09-08 DIAGNOSIS — I350 Nonrheumatic aortic (valve) stenosis: Secondary | ICD-10-CM

## 2016-09-08 DIAGNOSIS — I10 Essential (primary) hypertension: Secondary | ICD-10-CM | POA: Diagnosis not present

## 2016-09-08 DIAGNOSIS — I481 Persistent atrial fibrillation: Secondary | ICD-10-CM | POA: Diagnosis not present

## 2016-09-08 DIAGNOSIS — I5042 Chronic combined systolic (congestive) and diastolic (congestive) heart failure: Secondary | ICD-10-CM

## 2016-09-08 DIAGNOSIS — I4819 Other persistent atrial fibrillation: Secondary | ICD-10-CM

## 2016-09-08 DIAGNOSIS — N183 Chronic kidney disease, stage 3 unspecified: Secondary | ICD-10-CM

## 2016-09-08 NOTE — Progress Notes (Signed)
Patient ID: Wesley [name], male   DOB: [birth year], 82 y.o.   MRN: [id number] Patient ID: Wesley [name], male   DOB: [birth year], 82 y.o.   MRN: [id number]    Cardiology Office Note    Date:  09/08/2016   ID:  Wesley [name], DOB [birth year], MRN [id number]  PCP:  Horatio Pel, MD  Cardiologist:   Sanda Klein, MD   Chief Complaint  Patient presents with  . Follow-up    History of Present Illness:  Wesley [name] is a 82 y.o. 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 Despite atrial fibrillation, anticoagulants have been stopped due to very frequent and dangerous falls.  His family believes that he is too inactive. He has persistent ankle and calf edema and can only walk for 30 or 40 yards before he has to stop secondary to dyspnea. His weight on his home scale is 202 pounds (consistently 7 pounds less than our office scale). His most recent creatinine was 2.2, at the upper range of word has oscillated throughout the last year. He is taking metolazone twice weekly. He has a normal potassium level.   Although he has not had any serious injuries, he had another fall recently  He seems to require metolazone a minimum of twice weekly, sometimes a third dose.  He has severe left ventricular hypertrophy by echo but no significant hypervoltage on his electrocardiogram, raising the possibility of cardiac amyloidosis. His nuclear stress test did not raise show coronary insufficiency. His echocardiogram performed in February 2017 showed a left ventricular ejection fraction of 40-45%, comparable to the ejection fraction estimated by nuclear scintigraphy in October 2016 at 46%. The echo again confirmed that his aortic stenosis is very mild. The left atrium is severely dilated. The Doppler parameters suggest that he  was volume overloaded (E/e'=14). The systolic PA pressure was estimated at approximately 50 mmHg.    Past Medical History:  Diagnosis Date  . Arthritis    oa  . Chronic diastolic heart failure ([place]) 05/07/2015  . Essential hypertension 05/07/2015  . Family history of adverse reaction to anesthesia    daughter has difficulty waking   . Hyperlipidemia 05/07/2015  . Hypertension     Past Surgical History:  Procedure Laterality Date  . lower back    . TOTAL KNEE ARTHROPLASTY Bilateral     Outpatient Medications Prior to Visit  Medication Sig Dispense Refill  . Artificial Tear Ointment (REFRESH P.M. OP) Place 1 application into both eyes at bedtime as needed (dry eyes).    Marland Kitchen aspirin EC 81 MG tablet Take 1 tablet (81 mg total) by mouth daily. 90 tablet 3  . brimonidine (ALPHAGAN P) 0.1 % SOLN Place 1 drop into both eyes daily.    . Carboxymethylcellulose Sodium (THERATEARS OP) Place 1 drop into both eyes 5 (five) times daily as needed (for dry eyes (in between Alphagan and travatan)).     Marland Kitchen Cholecalciferol (VITAMIN D3) 5000 units TABS Take 5,000 Units by mouth daily.    . furosemide (LASIX) 40 MG tablet TAKE 2 TABLETS (80 MG TOTAL) BY MOUTH 2 (TWO) TIMES DAILY. 90 tablet 3  . KLOR-CON M10 10 MEQ tablet Take 2-4 tablets (20-40 mEq total) by mouth twice daily. May take additional tablets on Wednesdays and Saturdays, Mondays as needed. (Patient taking differently: Take 10-20 mEq by mouth as directed.  Take 82meq by mouth twice daily. On days pt takes metolazone pt take 88meq in the morning and then 66meq in the evening for a total of 40 meq throughout the day. (afternoon meds are given around 1400)) 240 tablet 11  . metolazone (ZAROXOLYN) 5 MG tablet Take 1 tablet (5 mg total) by mouth on Wednesdays and Saturdays. May take an extra tablet on Mondays if weight is 205 pounds or more. (Patient taking differently: Take 5 mg by mouth as directed. Take 1 tablet (5 mg total) by mouth on Wednesdays and  Saturdays. And May take an extra tablet on Mondays if needed for when weight is 205 pounds or more.) 30 tablet 5  . Probiotic Product (PROBIOTIC PO) Take 1 tablet by mouth daily as needed (for stomach irritation).     . Tamsulosin HCl (FLOMAX) 0.4 MG CAPS Take 0.4 mg by mouth daily.     . Travoprost, BAK Free, (TRAVATAN) 0.004 % SOLN ophthalmic solution Place 1 drop into both eyes at bedtime.    . vitamin C (ASCORBIC ACID) 500 MG tablet Take 500 mg by mouth daily.    Marland Kitchen amLODipine (NORVASC) 2.5 MG tablet Take 1 tablet (2.5 mg total) by mouth daily. 30 tablet 11   No facility-administered medications prior to visit.      Allergies:   Neosporin [neomycin-bacitracin zn-polymyx] and Penicillins   Social History   Social History  . Marital status: Widowed    Spouse name: N/A  . Number of children: N/A  . Years of education: N/A   Social History Main Topics  . Smoking status: Former Research scientist (life sciences)  . Smokeless tobacco: Never Used  . Alcohol use No  . Drug use: No  . Sexual activity: Not Asked   Other Topics Concern  . None   Social History Narrative  . None     Family History:  The patient's family history includes Diabetes in his son; Heart Problems in his mother; Hypertension in his daughter; Kidney disease in his sister; Stroke in his father.   ROS:   Please see the history of present illness.    ROS All other systems reviewed and are negative.   PHYSICAL EXAM:   VS:  BP 108/60 (BP Location: Right Arm, Patient Position: Sitting, Cuff Size: Normal)   Pulse 74   Ht 5\' 6"  (1.676 m)   Wt 209 lb 9.6 oz (95.1 kg)   BMI 33.83 kg/m    GEN: Well nourished, well developed, in no acute distress  HEENT: normal  Neck: no JVD, carotid bruits, or masses Cardiac: irregular; 2/6 early peaking right upper sternal border systolic ejection murmur, no diastolic murmurs, rubs, or gallops, symmetrical 2+ symmetrical pitting calf and ankle edema  Respiratory:  clear to auscultation bilaterally,  normal work of breathing GI: soft, nontender, nondistended, + BS MS: no deformity or atrophy  Skin: warm and dry, no rash Neuro:  Alert and Oriented x 3, Strength and sensation are intact Psych: euthymic mood, full affect  Wt Readings from Last 3 Encounters:  09/08/16 209 lb 9.6 oz (95.1 kg)  06/30/16 199 lb 2 oz (90.3 kg)  05/30/16 208 lb 12.8 oz (94.7 kg)      Studies/Labs Reviewed:   EKG:  EKG is not ordered today.    Recent Labs: 11/02/2015: B Natriuretic Peptide 397.6 01/30/2016: TSH 3.04 04/09/2016: ALT 17; Magnesium 2.6 05/09/2016: Hemoglobin 12.8; Platelets 189 09/04/2016: BUN 76; Creat 2.20; Potassium 3.9; Sodium 139     ASSESSMENT:    1.  Chronic combined systolic and diastolic congestive heart failure (HCC)   2. Persistent atrial fibrillation ([place])   3. Aortic stenosis, mild   4. Essential hypertension   5. Chronic renal insufficiency, stage III (moderate)      PLAN:  In order of problems listed above:  1. CHF: He seems to have primarily diastolic dysfunction, possible restrictive/infiltrative cardiomyopathy in this elderly man with severe left atrial dilatation and relatively low voltage on ECG, possible amyloidosis. He Seems to have some worsening signs of hypervolemia, both dyspnea and leg edema. Will reset his "dry weight" target at under 200 pounds on his home scale, which will probably mean he needs to increase the metolazone to 3 times a week on most weeks.  Continue at least monthly metabolic panels. I think we have to sacrifice some degree of renal function to achieve relief from symptoms of heart failure. Reviewed daily weights, signs and symptoms of heart failure exacerbation, sodium restriction. 2. AFib: Likely a permanent arrhythmia in view of the severe left atrial dilatation and his advanced age. He has spontaneously controlled ventricular rate. However, in view of his frequent falls, advanced age and overall frailty, the anticoagulant seems to now be more  of a liability then potential benefit. Thankfully he has not had any neurological events or other embolic complications 3. AS: this does not appear to be hemodynamically significant or contributing to heart failure 4. HTN: Blood pressure is well. Stop amlodipine which may be worsening leg swelling 5. CKD stage 3-4: Avoiding RAAS inhibitors and spironolactone due to recent hyperkalemia and rather volatile renal function. Monitor renal function carefully during periods of increased diuresis. Seems to have a baseline creatinine of around 2.0-2.2.  Medication Adjustments/Labs and Tests Ordered: Current medicines are reviewed at length with the patient today.  Concerns regarding medicines are outlined above.  Medication changes, Labs and Tests ordered today are listed in the Patient Instructions below. Patient Instructions  Dr Sallyanne Kuster has recommended making the following medication changes: 1. STOP Amlodipine  TARGET WEIGHT FOR ADDITIONAL METOLAZONE IS NOW 200 POUNDS.  Dr Sallyanne Kuster recommends that you schedule a follow-up appointment in 3 months.  If you need a refill on your cardiac medications before your next appointment, please call your pharmacy.  Signed, Sanda Klein, MD  09/08/2016 5:20 PM    [facility] [place], [place], [place]  [address] Phone: [phone]; Fax: [phone]   Patient ID: Wesley [name], male   DOB: Dec 30, 1920, 82 y.o.   MRN: [id number]

## 2016-09-08 NOTE — Patient Instructions (Signed)
Dr Sallyanne Kuster has recommended making the following medication changes: 1. STOP Amlodipine  TARGET WEIGHT FOR ADDITIONAL METOLAZONE IS NOW 200 POUNDS.  Dr Sallyanne Kuster recommends that you schedule a follow-up appointment in 3 months.  If you need a refill on your cardiac medications before your next appointment, please call your pharmacy.

## 2016-11-18 DIAGNOSIS — J069 Acute upper respiratory infection, unspecified: Secondary | ICD-10-CM | POA: Diagnosis not present

## 2016-12-01 ENCOUNTER — Telehealth: Payer: Self-pay | Admitting: Cardiovascular Disease

## 2016-12-01 NOTE — Telephone Encounter (Signed)
Please call,she needs to talk to you about getting his lab work.

## 2016-12-03 NOTE — Telephone Encounter (Signed)
Returned call to [employer]. She states that Dr Posey Pronto has ordered lab work for patient to have completed for upcoming office visit (3/22). Daughter called to see if Dr C needed labs completed as well. Per last office note, no labs were ordered. Will defer to [facility] for recommendations.

## 2016-12-04 NOTE — Telephone Encounter (Signed)
He can decide on labs when he sees Mr. Koman.  Dr. Debara Pickett

## 2016-12-05 DIAGNOSIS — H40013 Open angle with borderline findings, low risk, bilateral: Secondary | ICD-10-CM | POA: Diagnosis not present

## 2016-12-06 ENCOUNTER — Other Ambulatory Visit: Payer: Self-pay | Admitting: Cardiovascular Disease

## 2016-12-11 DIAGNOSIS — D631 Anemia in chronic kidney disease: Secondary | ICD-10-CM | POA: Diagnosis not present

## 2016-12-11 DIAGNOSIS — N2581 Secondary hyperparathyroidism of renal origin: Secondary | ICD-10-CM | POA: Diagnosis not present

## 2016-12-11 DIAGNOSIS — N183 Chronic kidney disease, stage 3 (moderate): Secondary | ICD-10-CM | POA: Diagnosis not present

## 2016-12-11 NOTE — Telephone Encounter (Signed)
The labs ordered by Dr. Posey Pronto should be sufficient

## 2016-12-11 NOTE — Telephone Encounter (Signed)
Patient's daughter has been notified that the labs from Dr. Posey Pronto should be sufficient according to Dr. Sallyanne Kuster.

## 2016-12-11 NOTE — Telephone Encounter (Signed)
Follow up     Pt daughter states she can not retrieve vm you left her about lab work please call

## 2016-12-16 ENCOUNTER — Ambulatory Visit (INDEPENDENT_AMBULATORY_CARE_PROVIDER_SITE_OTHER): Payer: Medicare Other | Admitting: Cardiovascular Disease

## 2016-12-16 ENCOUNTER — Encounter: Payer: Self-pay | Admitting: Cardiovascular Disease

## 2016-12-16 VITALS — BP 148/80 | HR 64 | Ht 66.0 in | Wt 209.2 lb

## 2016-12-16 DIAGNOSIS — N183 Chronic kidney disease, stage 3 unspecified: Secondary | ICD-10-CM

## 2016-12-16 DIAGNOSIS — I350 Nonrheumatic aortic (valve) stenosis: Secondary | ICD-10-CM

## 2016-12-16 DIAGNOSIS — I5042 Chronic combined systolic (congestive) and diastolic (congestive) heart failure: Secondary | ICD-10-CM | POA: Diagnosis not present

## 2016-12-16 DIAGNOSIS — I10 Essential (primary) hypertension: Secondary | ICD-10-CM | POA: Diagnosis not present

## 2016-12-16 DIAGNOSIS — I4819 Other persistent atrial fibrillation: Secondary | ICD-10-CM

## 2016-12-16 DIAGNOSIS — I481 Persistent atrial fibrillation: Secondary | ICD-10-CM | POA: Diagnosis not present

## 2016-12-16 NOTE — Progress Notes (Addendum)
Patient ID: Wesley [name], male   DOB: 01/27/1921, 83 y.o.   MRN: [id number] Patient ID: Wesley [name], male   DOB: 12-04-1920, 83 y.o.   MRN: [id number]    Cardiology Office Note    Date:  12/18/2016   ID:  Wesley [name], DOB [birth year], MRN [id number]  PCP:  Horatio Pel, MD  Cardiologist:   Sanda Klein, MD   No chief complaint on file.   History of Present Illness:  Wesley [name] is a 83 y.o. 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 Despite atrial fibrillation, anticoagulants have been stopped due to very frequent and dangerous falls.  He has not had any serious falls in the last couple weeks, but just last Wednesday he slid off his bed. He is suffering from an upper respiratory infection/bronchitis. His family has noticed marked worsening of his edema and despite increasing his diuretic dose has not noticed any improvement. His breathing seems a little more labored but he does not have orthopnea.  At this point he is taking metolazone twice weekly and is taking furosemide 80 mg in the morning and 40 mg in the afternoon every day. He is about 9 pounds above target dry weight.  He has severe left ventricular hypertrophy by echo but no significant hypervoltage on his electrocardiogram, raising the possibility of cardiac amyloidosis. His nuclear stress test did not show coronary insufficiency. His echocardiogram performed in February 2017 showed a left ventricular ejection fraction of 40-45%, comparable to the ejection fraction estimated by nuclear scintigraphy in October 2016 at 46%. The echo again confirmed that his aortic stenosis is very mild. The left atrium is severely dilated. The Doppler parameters suggest that he was volume overloaded (E/e'=14). The systolic PA pressure was estimated at  approximately 50 mmHg.    Past Medical History:  Diagnosis Date  . Arthritis    oa  . Chronic diastolic heart failure ([place]) 05/07/2015  . Essential hypertension 05/07/2015  . Family history of adverse reaction to anesthesia    daughter has difficulty waking   . Hyperlipidemia 05/07/2015  . Hypertension     Past Surgical History:  Procedure Laterality Date  . lower back    . TOTAL KNEE ARTHROPLASTY Bilateral     Outpatient Medications Prior to Visit  Medication Sig Dispense Refill  . Artificial Tear Ointment (REFRESH P.M. OP) Place 1 application into both eyes at bedtime as needed (dry eyes).    Marland Kitchen aspirin EC 81 MG tablet Take 1 tablet (81 mg total) by mouth daily. 90 tablet 3  . brimonidine (ALPHAGAN P) 0.1 % SOLN Place 1 drop into both eyes daily.    . Carboxymethylcellulose Sodium (THERATEARS OP) Place 1 drop into both eyes 5 (five) times daily as needed (for dry eyes (in between Alphagan and travatan)).     Marland Kitchen Cholecalciferol (VITAMIN D3) 5000 units TABS Take 5,000 Units by mouth daily.    . furosemide (LASIX) 40 MG tablet TAKE 2 TABLETS (80 MG TOTAL) BY MOUTH 2 (TWO) TIMES DAILY. 90 tablet 3  . KLOR-CON M10 10 MEQ tablet Take 2-4 tablets (20-40 mEq total) by mouth twice daily. May take additional tablets on Wednesdays and Saturdays, Mondays as needed. (Patient taking differently: Take 10-20 mEq by mouth as directed. Take 64meq by mouth twice daily. On days pt takes metolazone pt take 56meq in the morning  and then 51meq in the evening for a total of 40 meq throughout the day. (afternoon meds are given around 1400)) 240 tablet 11  . metolazone (ZAROXOLYN) 5 MG tablet Take 1 tablet (5 mg total) by mouth on Wednesdays and Saturdays. May take an extra tablet on Mondays if weight is 205 pounds or more. (Patient taking differently: Take 5 mg by mouth as directed. Take 1 tablet (5 mg total) by mouth on Wednesdays and Saturdays. And May take an extra tablet on Mondays if needed for when weight is  205 pounds or more.) 30 tablet 5  . Probiotic Product (PROBIOTIC PO) Take 1 tablet by mouth daily as needed (for stomach irritation).     . Tamsulosin HCl (FLOMAX) 0.4 MG CAPS Take 0.4 mg by mouth daily.     . Travoprost, BAK Free, (TRAVATAN) 0.004 % SOLN ophthalmic solution Place 1 drop into both eyes at bedtime.    . vitamin C (ASCORBIC ACID) 500 MG tablet Take 500 mg by mouth daily.     No facility-administered medications prior to visit.      Allergies:   Neosporin [neomycin-bacitracin zn-polymyx] and Penicillins   Social History   Social History  . Marital status: Widowed    Spouse name: N/A  . Number of children: N/A  . Years of education: N/A   Social History Main Topics  . Smoking status: Former Research scientist (life sciences)  . Smokeless tobacco: Never Used  . Alcohol use No  . Drug use: No  . Sexual activity: Not Asked   Other Topics Concern  . None   Social History Narrative  . None     Family History:  The patient's family history includes Diabetes in his son; Heart Problems in his mother; Hypertension in his daughter; Kidney disease in his sister; Stroke in his father.   ROS:   Please see the history of present illness.    ROS All other systems reviewed and are negative.   PHYSICAL EXAM:   VS:  BP (!) 148/80   Pulse 64   Ht 5\' 6"  (1.676 m)   Wt 94.9 kg (209 lb 3.2 oz)   BMI 33.77 kg/m    GEN: Well nourished, well developed, in no acute distress  HEENT: normal  Neck: no JVD, carotid bruits, or masses Cardiac: irregular; 2/6 early peaking right upper sternal border systolic ejection murmur, no diastolic murmurs, rubs, or gallops, 3+ symmetrical pitting calf and ankle edema  Respiratory:  clear to auscultation bilaterally, normal work of breathing GI: soft, nontender, nondistended, + BS MS: no deformity or atrophy  Skin: warm and dry, no rash Neuro:  Alert and Oriented x 3, Strength and sensation are intact Psych: euthymic mood, full affect  Wt Readings from Last 3  Encounters:  12/16/16 94.9 kg (209 lb 3.2 oz)  09/08/16 95.1 kg (209 lb 9.6 oz)  06/30/16 90.3 kg (199 lb 2 oz)      Studies/Labs Reviewed:   EKG:  EKG is not ordered today.    Recent Labs: 01/30/2016: TSH 3.04 04/09/2016: ALT 17; Magnesium 2.6 05/09/2016: Hemoglobin 12.8; Platelets 189 09/04/2016: BUN 76; Creat 2.20; Potassium 3.9; Sodium 139   Last month sodium 138, potassium 3.9, BUN 96, creatinine 2.38, hemoglobin 14.7 with normal iron studies, WBC 8.6k  ASSESSMENT:    1. Chronic combined systolic and diastolic CHF (congestive heart failure) (HCC)   2. Persistent atrial fibrillation ([place])   3. Aortic stenosis, mild   4. Essential hypertension   5. Chronic renal insufficiency,  stage III (moderate)      PLAN:  In order of problems listed above:  1. CHF: He seems to have primarily diastolic dysfunction, possible restrictive/infiltrative cardiomyopathy in this elderly man with severe left atrial dilatation and relatively low voltage on ECG, possible amyloidosis. He is hypervolemic, roughly 9 pounds above his "dry weight" target at under 200 pounds on his home scale. Increase furosemide to 80 mg twice daily and take metolazone daily at least until she sees Dr. Posey Pronto in a couple of days.  Continue at least monthly metabolic panels. I think we have to sacrifice some degree of renal function to achieve relief from symptoms of heart failure. Reviewed daily weights, signs and symptoms of heart failure exacerbation, sodium restriction. 2. AFib: Likely a permanent arrhythmia, spontaneously controlled ventricular rate. Anticoagulation stopped due to frequent falls and injuries. Thankfully he has not had any neurological events or other embolic complications 3. AS: this does not appear to be hemodynamically significant or contributing to heart failure 4. HTN: Blood pressure is high today, usually well-controlled. At his last appointment and we stopped the amlodipine which I thought might be  worsening leg swelling. We'll see where his blood pressure tends up with more intense diuresis 5. CKD stage 3-4: Avoiding RAAS inhibitors and spironolactone due to hyperkalemia and volatile renal function. Monitor renal function carefully during periods of increased diuresis. Seems to have a baseline creatinine of around 2.0-2.2. As an appointment with Dr. Posey Pronto Thursday.  Medication Adjustments/Labs and Tests Ordered: Current medicines are reviewed at length with the patient today.  Concerns regarding medicines are outlined above.  Medication changes, Labs and Tests ordered today are listed in the Patient Instructions below. Patient Instructions  Dr Sallyanne Kuster has recommended making the following medication changes: 1. UNTIL Thursday  >>Take Furosemide 80 mg twice daily  >>Take Potassium 40 mEq twice daily  >>Take Metolazone 5 mg once daily  Your physician recommends that you schedule a follow-up appointment in 2 weeks with a NP/PA.  Dr Sallyanne Kuster recommends that you schedule a follow-up appointment first available.  If you need a refill on your cardiac medications before your next appointment, please call your pharmacy.  Signed, Sanda Klein, MD  12/18/2016 6:58 PM    [address], [place], [place]  [address] Phone: [phone]; Fax: [phone]   Patient ID: Wesley [name], male   DOB: 24-Jun-1921, 83 y.o.   MRN: [id number]

## 2016-12-16 NOTE — Patient Instructions (Signed)
Dr Sallyanne Kuster has recommended making the following medication changes: 1. UNTIL Thursday  >>Take Furosemide 80 mg twice daily  >>Take Potassium 40 mEq twice daily  >>Take Metolazone 5 mg once daily  Your physician recommends that you schedule a follow-up appointment in 2 weeks with a NP/PA.  Dr Sallyanne Kuster recommends that you schedule a follow-up appointment first available.  If you need a refill on your cardiac medications before your next appointment, please call your pharmacy.

## 2016-12-19 ENCOUNTER — Encounter (HOSPITAL_COMMUNITY): Payer: Self-pay

## 2016-12-19 ENCOUNTER — Telehealth: Payer: Self-pay | Admitting: Cardiovascular Disease

## 2016-12-19 ENCOUNTER — Inpatient Hospital Stay (HOSPITAL_COMMUNITY)
Admission: AD | Admit: 2016-12-19 | Discharge: 2016-12-25 | DRG: 291 | Disposition: A | Discharge status: Skilled Nursing Facility | Payer: Medicare Other | Source: Ambulatory Visit | Attending: Cardiovascular Disease | Admitting: Cardiovascular Disease

## 2016-12-19 DIAGNOSIS — I350 Nonrheumatic aortic (valve) stenosis: Secondary | ICD-10-CM | POA: Diagnosis not present

## 2016-12-19 DIAGNOSIS — I272 Pulmonary hypertension, unspecified: Secondary | ICD-10-CM | POA: Diagnosis not present

## 2016-12-19 DIAGNOSIS — I4819 Other persistent atrial fibrillation: Secondary | ICD-10-CM | POA: Diagnosis present

## 2016-12-19 DIAGNOSIS — I5043 Acute on chronic combined systolic (congestive) and diastolic (congestive) heart failure: Secondary | ICD-10-CM | POA: Diagnosis present

## 2016-12-19 DIAGNOSIS — Z880 Allergy status to penicillin: Secondary | ICD-10-CM | POA: Diagnosis not present

## 2016-12-19 DIAGNOSIS — N184 Chronic kidney disease, stage 4 (severe): Secondary | ICD-10-CM | POA: Diagnosis not present

## 2016-12-19 DIAGNOSIS — I130 Hypertensive heart and chronic kidney disease with heart failure and stage 1 through stage 4 chronic kidney disease, or unspecified chronic kidney disease: Principal | ICD-10-CM | POA: Diagnosis present

## 2016-12-19 DIAGNOSIS — Z96653 Presence of artificial knee joint, bilateral: Secondary | ICD-10-CM | POA: Diagnosis present

## 2016-12-19 DIAGNOSIS — Z66 Do not resuscitate: Secondary | ICD-10-CM | POA: Diagnosis not present

## 2016-12-19 DIAGNOSIS — Z79899 Other long term (current) drug therapy: Secondary | ICD-10-CM

## 2016-12-19 DIAGNOSIS — E876 Hypokalemia: Secondary | ICD-10-CM | POA: Diagnosis present

## 2016-12-19 DIAGNOSIS — I5033 Acute on chronic diastolic (congestive) heart failure: Secondary | ICD-10-CM | POA: Diagnosis present

## 2016-12-19 DIAGNOSIS — M1990 Unspecified osteoarthritis, unspecified site: Secondary | ICD-10-CM | POA: Diagnosis not present

## 2016-12-19 DIAGNOSIS — Z87891 Personal history of nicotine dependence: Secondary | ICD-10-CM

## 2016-12-19 DIAGNOSIS — E785 Hyperlipidemia, unspecified: Secondary | ICD-10-CM | POA: Diagnosis present

## 2016-12-19 DIAGNOSIS — I482 Chronic atrial fibrillation: Secondary | ICD-10-CM | POA: Diagnosis not present

## 2016-12-19 DIAGNOSIS — N179 Acute kidney failure, unspecified: Secondary | ICD-10-CM | POA: Diagnosis not present

## 2016-12-19 DIAGNOSIS — I493 Ventricular premature depolarization: Secondary | ICD-10-CM | POA: Diagnosis present

## 2016-12-19 DIAGNOSIS — Z883 Allergy status to other anti-infective agents status: Secondary | ICD-10-CM

## 2016-12-19 DIAGNOSIS — N183 Chronic kidney disease, stage 3 unspecified: Secondary | ICD-10-CM | POA: Diagnosis present

## 2016-12-19 DIAGNOSIS — Z9181 History of falling: Secondary | ICD-10-CM | POA: Diagnosis not present

## 2016-12-19 DIAGNOSIS — I472 Ventricular tachycardia: Secondary | ICD-10-CM | POA: Diagnosis not present

## 2016-12-19 DIAGNOSIS — I10 Essential (primary) hypertension: Secondary | ICD-10-CM | POA: Diagnosis present

## 2016-12-19 DIAGNOSIS — Z7982 Long term (current) use of aspirin: Secondary | ICD-10-CM | POA: Diagnosis not present

## 2016-12-19 DIAGNOSIS — E8770 Fluid overload, unspecified: Secondary | ICD-10-CM | POA: Diagnosis not present

## 2016-12-19 DIAGNOSIS — I481 Persistent atrial fibrillation: Secondary | ICD-10-CM | POA: Diagnosis not present

## 2016-12-19 DIAGNOSIS — I4891 Unspecified atrial fibrillation: Secondary | ICD-10-CM | POA: Diagnosis not present

## 2016-12-19 LAB — COMPREHENSIVE METABOLIC PANEL
ALK PHOS: 70 U/L (ref 38–126)
ALT: 30 U/L (ref 17–63)
ANION GAP: 14 (ref 5–15)
AST: 51 U/L — ABNORMAL HIGH (ref 15–41)
Albumin: 3.6 g/dL (ref 3.5–5.0)
BILIRUBIN TOTAL: 1.5 mg/dL — AB (ref 0.3–1.2)
BUN: 99 mg/dL — ABNORMAL HIGH (ref 6–20)
CALCIUM: 9.2 mg/dL (ref 8.9–10.3)
CO2: 33 mmol/L — ABNORMAL HIGH (ref 22–32)
Chloride: 88 mmol/L — ABNORMAL LOW (ref 101–111)
Creatinine, Ser: 2.25 mg/dL — ABNORMAL HIGH (ref 0.61–1.24)
GFR calc non Af Amer: 23 mL/min — ABNORMAL LOW (ref >60)
GFR, EST AFRICAN AMERICAN: 27 mL/min — AB (ref >60)
Glucose, Bld: 109 mg/dL — ABNORMAL HIGH (ref 65–99)
Potassium: 3.6 mmol/L (ref 3.5–5.1)
Sodium: 135 mmol/L (ref 135–145)
TOTAL PROTEIN: 5.9 g/dL — AB (ref 6.5–8.1)

## 2016-12-19 LAB — BRAIN NATRIURETIC PEPTIDE: B Natriuretic Peptide: 735.8 pg/mL — ABNORMAL HIGH (ref 0.0–100.0)

## 2016-12-19 LAB — CBC
HEMATOCRIT: 41.6 % (ref 39.0–52.0)
Hemoglobin: 14 g/dL (ref 13.0–17.0)
MCH: 31.7 pg (ref 26.0–34.0)
MCHC: 33.7 g/dL (ref 30.0–36.0)
MCV: 94.1 fL (ref 78.0–100.0)
PLATELETS: 151 10*3/uL (ref 150–400)
RBC: 4.42 MIL/uL (ref 4.22–5.81)
RDW: 14.4 % (ref 11.5–15.5)
WBC: 9 10*3/uL (ref 4.0–10.5)

## 2016-12-19 LAB — MRSA PCR SCREENING: MRSA by PCR: NEGATIVE

## 2016-12-19 MED ORDER — HEPARIN SODIUM (PORCINE) 5000 UNIT/ML IJ SOLN
5000.0000 [IU] | Freq: Three times a day (TID) | INTRAMUSCULAR | Status: DC
  Administered 2016-12-19 – 2016-12-25 (×17): 5000 [IU] via SUBCUTANEOUS
  Filled 2016-12-19 (×16): qty 1

## 2016-12-19 MED ORDER — POTASSIUM CHLORIDE CRYS ER 20 MEQ PO TBCR
20.0000 meq | EXTENDED_RELEASE_TABLET | Freq: Two times a day (BID) | ORAL | Status: DC
  Administered 2016-12-19 – 2016-12-20 (×3): 20 meq via ORAL
  Filled 2016-12-19 (×3): qty 1

## 2016-12-19 MED ORDER — ACETAMINOPHEN 325 MG PO TABS
650.0000 mg | ORAL_TABLET | ORAL | Status: DC | PRN
  Administered 2016-12-23 – 2016-12-25 (×4): 650 mg via ORAL
  Filled 2016-12-19 (×4): qty 2

## 2016-12-19 MED ORDER — ASPIRIN EC 81 MG PO TBEC
81.0000 mg | DELAYED_RELEASE_TABLET | Freq: Every day | ORAL | Status: DC
  Administered 2016-12-20 – 2016-12-25 (×6): 81 mg via ORAL
  Filled 2016-12-19 (×6): qty 1

## 2016-12-19 MED ORDER — FUROSEMIDE 10 MG/ML IJ SOLN
80.0000 mg | Freq: Two times a day (BID) | INTRAMUSCULAR | Status: DC
  Administered 2016-12-19 – 2016-12-22 (×6): 80 mg via INTRAVENOUS
  Filled 2016-12-19 (×6): qty 8

## 2016-12-19 MED ORDER — BRIMONIDINE TARTRATE 0.15 % OP SOLN
1.0000 [drp] | Freq: Every day | OPHTHALMIC | Status: DC
  Administered 2016-12-19 – 2016-12-25 (×7): 1 [drp] via OPHTHALMIC
  Filled 2016-12-19: qty 5

## 2016-12-19 MED ORDER — TAMSULOSIN HCL 0.4 MG PO CAPS
0.4000 mg | ORAL_CAPSULE | Freq: Every day | ORAL | Status: DC
  Administered 2016-12-20 – 2016-12-25 (×6): 0.4 mg via ORAL
  Filled 2016-12-19 (×6): qty 1

## 2016-12-19 MED ORDER — LATANOPROST 0.005 % OP SOLN
1.0000 [drp] | Freq: Every day | OPHTHALMIC | Status: DC
  Administered 2016-12-19 – 2016-12-24 (×6): 1 [drp] via OPHTHALMIC
  Filled 2016-12-19: qty 2.5

## 2016-12-19 MED ORDER — METOLAZONE 5 MG PO TABS
5.0000 mg | ORAL_TABLET | Freq: Every day | ORAL | Status: DC
  Administered 2016-12-20 – 2016-12-25 (×6): 5 mg via ORAL
  Filled 2016-12-19 (×6): qty 1

## 2016-12-19 MED ORDER — VITAMIN C 500 MG PO TABS
500.0000 mg | ORAL_TABLET | Freq: Every day | ORAL | Status: DC
  Administered 2016-12-20 – 2016-12-25 (×6): 500 mg via ORAL
  Filled 2016-12-19 (×6): qty 1

## 2016-12-19 MED ORDER — NITROGLYCERIN 0.4 MG SL SUBL: 0.4000 mg | SUBLINGUAL_TABLET | SUBLINGUAL | Status: DC | PRN

## 2016-12-19 MED ORDER — ONDANSETRON HCL 4 MG/2ML IJ SOLN: 4.0000 mg | Freq: Four times a day (QID) | INTRAMUSCULAR | Status: DC | PRN

## 2016-12-19 NOTE — Telephone Encounter (Signed)
Spoke with pt daughter she states that they were late for the Dr Posey Pronto appt yesterday and Dr was already gone and they were told that Dr Posey Pronto told them to go to the ER. She states that she did not go because she states that she did not want to him to the ER because she states that she did not know what to tell them when she got there. She states that everyday since Tuesday she has given pt lasix 80mg  BID and metolazone 5mg  and pt is still up 8 pounds in a week.  Please advise what to do next.

## 2016-12-19 NOTE — Progress Notes (Signed)
Page to [facility] to request code status change, per pt report of DNR

## 2016-12-19 NOTE — Consult Note (Signed)
[facility] Renal Consultation Note  Requesting MD: Croitoru Indication for Consultation: CKD and volume overload  HPI:  Wesley [name] is a 83 y.o. male with hypotension, hyperlipidemia, per cardiology note severe left ventricular hypertrophy, ejection fraction of 40-45% in 2017, features more consistent with diastolic heart failure but also A. fib. He also has fairly advanced CKD with GFR in the 20s followed by Dr. Posey Pronto at [facility]. He last saw Dr. Posey Pronto in the fall but labs were drawn on 3/15 showing a creatinine of 2.38 which is a little bit worse than his baseline.  Patient also noted to see his heart doctor on 3/20 where he had weight gain. Furosemide was increased to 80 twice a day and metolazone daily. He was set up to see Dr. Posey Pronto today but daughter called with patient complaining of shortness of breath so he was direct admitted for IV diuresis. According to Dr. Serita Grit note dialysis has been discussed and patient declines appropriately any form of dialysis therapy.  In the admission orders Lasix is ordered at 80 mg IV twice a day.  Blood pressures 121/67 and oxygen saturation is 95% on room air. Weight is 93.6 kg- last weight in our office was 94.5 kg  Creat  Date/Time Value Ref Range Status  09/04/2016 11:57 AM 2.20 (H) 0.70 - 1.11 mg/dL Final    Comment:      For patients > or = 81 years of age: The upper reference limit for Creatinine is approximately 13% higher for people identified as African-American.     05/27/2016 03:26 PM 2.08 (H) 0.70 - 1.11 mg/dL Final    Comment:      For patients > or = 81 years of age: The upper reference limit for Creatinine is approximately 13% higher for people identified as African-American.     04/29/2016 01:59 PM 2.18 (H) 0.70 - 1.11 mg/dL Final    Comment:      For patients > or = 81 years of age: The upper reference limit for Creatinine is approximately 13% higher for people identified  as African-American.     04/09/2016 01:46 PM 1.99 (H) 0.70 - 1.11 mg/dL Final    Comment:      For patients > or = 81 years of age: The upper reference limit for Creatinine is approximately 13% higher for people identified as African-American.     03/10/2016 02:12 PM 1.87 (H) 0.70 - 1.11 mg/dL Final  02/14/2016 11:16 AM 1.74 (H) 0.70 - 1.11 mg/dL Final  01/30/2016 03:37 PM 1.81 (H) 0.70 - 1.11 mg/dL Final  01/25/2016 11:53 AM 1.95 (H) 0.70 - 1.11 mg/dL Final  01/09/2016 10:58 AM 2.21 (H) 0.70 - 1.11 mg/dL Final  01/02/2016 10:59 AM 1.99 (H) 0.70 - 1.11 mg/dL Final  12/05/2015 11:24 AM 1.85 (H) 0.70 - 1.11 mg/dL Final   Creatinine, Ser  Date/Time Value Ref Range Status  05/09/2016 09:33 AM 1.95 (H) 0.61 - 1.24 mg/dL Final  11/08/2015 06:08 AM 1.60 (H) 0.61 - 1.24 mg/dL Final  11/07/2015 12:35 PM 1.69 (H) 0.61 - 1.24 mg/dL Final  11/05/2015 05:09 AM 1.57 (H) 0.61 - 1.24 mg/dL Final  11/04/2015 03:14 AM 1.59 (H) 0.61 - 1.24 mg/dL Final  11/03/2015 03:02 AM 1.73 (H) 0.61 - 1.24 mg/dL Final  11/02/2015 04:11 PM 1.85 (H) 0.61 - 1.24 mg/dL Final  11/21/2007 05:20 AM 1.32  Final  11/20/2007 03:43 AM 1.15  Final  11/19/2007 01:36 PM 1.05  Final     PMHx:  Past Medical History:  Diagnosis Date  . Arthritis    oa  . Chronic diastolic heart failure ([place]) 05/07/2015  . Essential hypertension 05/07/2015  . Family history of adverse reaction to anesthesia    daughter has difficulty waking   . Hyperlipidemia 05/07/2015  . Hypertension     Past Surgical History:  Procedure Laterality Date  . lower back    . TOTAL KNEE ARTHROPLASTY Bilateral     Family Hx:  Family History  Problem Relation Age of Onset  . Heart Problems Mother   . Stroke Father   . Kidney disease Sister   . Hypertension Daughter   . Diabetes Son     Social History:  reports that he has quit smoking. He has never used smokeless tobacco. He reports that he does not drink alcohol or use drugs.  Allergies:   Allergies  Allergen Reactions  . Neosporin [Neomycin-Bacitracin Zn-Polymyx] Itching and Rash  . Penicillins Itching and Rash    Has patient had a PCN reaction causing immediate rash, facial/tongue/throat swelling, SOB or lightheadedness with hypotension: Yes Has patient had a PCN reaction causing severe rash involving mucus membranes or skin necrosis: No Has patient had a PCN reaction that required hospitalization No Has patient had a PCN reaction occurring within the last 10 years: Yes If all of the above answers are "NO", then may proceed with Cephalosporin use.    Medications: Prior to Admission medications   Medication Sig Start Date End Date Taking? Authorizing Provider  Artificial Tear Ointment (REFRESH P.M. OP) Place 1 application into both eyes at bedtime as needed (dry eyes).   Yes Historical Provider, MD  aspirin EC 81 MG tablet Take 1 tablet (81 mg total) by mouth daily. 05/07/16  Yes Mihai Croitoru, MD  brimonidine (ALPHAGAN P) 0.1 % SOLN Place 1 drop into both eyes daily.   Yes Historical Provider, MD  Cholecalciferol (VITAMIN D3) 5000 units TABS Take 5,000 Units by mouth daily.   Yes Historical Provider, MD  furosemide (LASIX) 40 MG tablet TAKE 2 TABLETS (80 MG TOTAL) BY MOUTH 2 (TWO) TIMES DAILY. 12/08/16  Yes Mihai Croitoru, MD  KLOR-CON M10 10 MEQ tablet Take 2-4 tablets (20-40 mEq total) by mouth twice daily. May take additional tablets on Wednesdays and Saturdays, Mondays as needed. Patient taking differently: Take 10-20 mEq by mouth as directed. Take 48meq by mouth twice daily. On days pt takes metolazone pt take 74meq in the morning and then 68meq in the evening for a total of 40 meq throughout the day. (afternoon meds are given around 1400) 05/07/16  Yes Mihai Croitoru, MD  metolazone (ZAROXOLYN) 5 MG tablet Take 1 tablet (5 mg total) by mouth on Wednesdays and Saturdays. May take an extra tablet on Mondays if weight is 205 pounds or more. Patient taking differently: Take 5  mg by mouth as directed. Take 1 tablet (5 mg total) by mouth on Wednesdays and Saturdays. And May take an extra tablet on Mondays if needed for when weight is 205 pounds or more. 05/07/16  Yes Mihai Croitoru, MD  Probiotic Product (PROBIOTIC PO) Take 1 tablet by mouth daily as needed (for stomach irritation).    Yes Historical Provider, MD  Tamsulosin HCl (FLOMAX) 0.4 MG CAPS Take 0.4 mg by mouth daily.  07/29/12  Yes Historical Provider, MD  Travoprost, BAK Free, (TRAVATAN) 0.004 % SOLN ophthalmic solution Place 1 drop into both eyes at bedtime.   Yes Historical Provider, MD  vitamin C (ASCORBIC ACID) 500  MG tablet Take 500 mg by mouth daily.   Yes Historical Provider, MD    I have reviewed the patient's current medications.  Labs: No results found for this or any previous visit (from the past 48 hour(s)).   ROS:  A comprehensive review of systems was negative except for: Cardiovascular: positive for dyspnea and lower extremity edema  Physical Exam: Vitals:   12/19/16 1534  BP: 121/67  Pulse: (!) 108  Resp: 18  Temp: 98 F (36.7 C)     General: elderly- HOH HEENT: PERRLA, EOMI, mucous membranes moist Neck: no JVD Heart: RRR Lungs: dec BS at bases Abdomen: soft, non tender Extremities: 2+ pitting edema Skin: warm and dry Neuro: alert, HOH- pleasantly confused   Assessment/Plan: 83 year old white male with history of heart failure as well as CKD- patient now admitted with worsening volume. Last creatinine might be slightly worse than baseline. 1.Renal- patient with advanced CK D at baseline. Awaiting creatinine today but on 3/15 was slightly worse than baseline. Patient also takes Flomax so wonder if there is a bladder outlet obstruction component. Will place Foley catheter to not only keep track of urine output but also alleviate that if it is an issue. Patient is not a candidate for chronic dialysis and is decided against it as well. 2. Hypertension/volume  - blood pressure  reasonable on no BP meds other than Flomax. Has been ordered IV Lasix 80 mg twice a day we will add metolazone as well as he takes at home at least twice a week and has been taking it lately daily- reassess daily     Wesley [name] A 12/19/2016, 5:35 PM

## 2016-12-19 NOTE — H&P (Signed)
Patient ID: Wesley [name] MRN: [id number], DOB/AGE: Sep 21, 1921   Admit date: 12/19/2016   Primary Physician: Horatio Pel, MD Primary Cardiologist: Dr. Sallyanne Kuster  Pt. Profile:  Wesley [name] is a 83 y.o. 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    HPI: As above. He requires fairly frequent adjustment in diuretic dose due to recurrent problems with edema and dyspnea and concomitant severe renal disease. Despite atrial fibrillation, anticoagulants have been stopped due to very frequent and dangerous falls.  Per note He has severe left ventricular hypertrophy by echo but no significant hypervoltage on his electrocardiogram, raising the possibility of cardiac amyloidosis. His nuclear stress test did not show coronary insufficiency. His echocardiogram performed in February 2017 showed a left ventricular ejection fraction of 40-45%, comparable to the ejection fraction estimated by nuclear scintigraphy in October 2016 at 46%. The echo again confirmed that his aortic stenosis is very mild. The left atrium is severely dilated. The Doppler parameters suggest that he was volume overloaded (E/e'=14). The systolic PA pressure was estimated at approximately 50 mmHg.  Last seen by Dr. Sallyanne Kuster 12/16/16. He gained 9lb prior to that visit. Increased Furosemide to 80 mg BID and advised to take metolazone daily at least until sees Dr. Posey Pronto in a couple of days. However patient missed the appointment. Has has lost 2lb since seen in clinic. However he continued to feel shortness of breath. Mostly with minimal exertion. No orthopnea or PND. Daughter called the office and admitted directly. Declined in urine out put. No further fall. No chest pain, palpitations, dizziness, melena. Has LE edema. Scr of 2.38 on 12/12/16.   Problem List  Past Medical History:  Diagnosis Date  . Arthritis    oa  .  Chronic diastolic heart failure ([place]) 05/07/2015  . Essential hypertension 05/07/2015  . Family history of adverse reaction to anesthesia    daughter has difficulty waking   . Hyperlipidemia 05/07/2015  . Hypertension     Past Surgical History:  Procedure Laterality Date  . lower back    . TOTAL KNEE ARTHROPLASTY Bilateral      Allergies  Allergies  Allergen Reactions  . Neosporin [Neomycin-Bacitracin Zn-Polymyx] Itching and Rash  . Penicillins Itching and Rash    Has patient had a PCN reaction causing immediate rash, facial/tongue/throat swelling, SOB or lightheadedness with hypotension: Yes Has patient had a PCN reaction causing severe rash involving mucus membranes or skin necrosis: No Has patient had a PCN reaction that required hospitalization No Has patient had a PCN reaction occurring within the last 10 years: Yes If all of the above answers are "NO", then may proceed with Cephalosporin use.     Home Medications  Prior to Admission medications   Medication Sig Start Date End Date Taking? Authorizing Provider  Artificial Tear Ointment (REFRESH P.M. OP) Place 1 application into both eyes at bedtime as needed (dry eyes).    Historical Provider, MD  aspirin EC 81 MG tablet Take 1 tablet (81 mg total) by mouth daily. 05/07/16   Mihai Croitoru, MD  brimonidine (ALPHAGAN P) 0.1 % SOLN Place 1 drop into both eyes daily.    Historical Provider, MD  Carboxymethylcellulose Sodium (THERATEARS OP) Place 1 drop into both eyes 5 (five) times daily as needed (for dry eyes (in between Alphagan and travatan)).     Historical Provider, MD  Cholecalciferol (VITAMIN D3) 5000 units TABS Take  5,000 Units by mouth daily.    Historical Provider, MD  furosemide (LASIX) 40 MG tablet TAKE 2 TABLETS (80 MG TOTAL) BY MOUTH 2 (TWO) TIMES DAILY. 12/08/16   Mihai Croitoru, MD  KLOR-CON M10 10 MEQ tablet Take 2-4 tablets (20-40 mEq total) by mouth twice daily. May take additional tablets on Wednesdays and  Saturdays, Mondays as needed. Patient taking differently: Take 10-20 mEq by mouth as directed. Take 51meq by mouth twice daily. On days pt takes metolazone pt take 5meq in the morning and then 79meq in the evening for a total of 40 meq throughout the day. (afternoon meds are given around 1400) 05/07/16   Mihai Croitoru, MD  metolazone (ZAROXOLYN) 5 MG tablet Take 1 tablet (5 mg total) by mouth on Wednesdays and Saturdays. May take an extra tablet on Mondays if weight is 205 pounds or more. Patient taking differently: Take 5 mg by mouth as directed. Take 1 tablet (5 mg total) by mouth on Wednesdays and Saturdays. And May take an extra tablet on Mondays if needed for when weight is 205 pounds or more. 05/07/16   Mihai Croitoru, MD  Probiotic Product (PROBIOTIC PO) Take 1 tablet by mouth daily as needed (for stomach irritation).     Historical Provider, MD  Tamsulosin HCl (FLOMAX) 0.4 MG CAPS Take 0.4 mg by mouth daily.  07/29/12   Historical Provider, MD  Travoprost, BAK Free, (TRAVATAN) 0.004 % SOLN ophthalmic solution Place 1 drop into both eyes at bedtime.    Historical Provider, MD  vitamin C (ASCORBIC ACID) 500 MG tablet Take 500 mg by mouth daily.    Historical Provider, MD    Family History  Family History  Problem Relation Age of Onset  . Heart Problems Mother   . Stroke Father   . Kidney disease Sister   . Hypertension Daughter   . Diabetes Son    Family Status  Relation Status  . Mother Deceased at age 70  . Father Deceased at age 58  . Sister Deceased at age 10  . Brother Alive  . Daughter Alive  . Son Alive     Social History  Social History   Social History  . Marital status: Widowed    Spouse name: N/A  . Number of children: N/A  . Years of education: N/A   Occupational History  . Not on file.   Social History Main Topics  . Smoking status: Former Research scientist (life sciences)  . Smokeless tobacco: Never Used  . Alcohol use No  . Drug use: No  . Sexual activity: Not on file    Other Topics Concern  . Not on file   Social History Narrative  . No narrative on file     All other systems reviewed and are otherwise negative except as noted above.  Physical Exam  Blood pressure 121/67, pulse (!) 108, temperature 98 F (36.7 C), temperature source Oral, resp. rate 18, height 5\' 6"  (1.676 m), weight 206 lb 6.4 oz (93.6 kg), SpO2 95 %.  General: Pleasant, NAD Psych: Normal affect. Neuro: Alert and oriented X 3. Moves all extremities spontaneously. HEENT: Normal  Neck: Supple without bruits  JVP is increased   Lungs:  Resp regular and unlabored  Rales at L base   Heart: Irregular  no s3, s4, 2/6 systolic murmurs. Abdomen: Soft, non-tender, non-distended, BS + x 4.  Extremities: No clubbing, cyanosis 2+ BL LE  edema. DP/PT/Radials 2+ and equal bilaterally.  Labs  No results for input(s): CKTOTAL,  CKMB, TROPONINI in the last 72 hours. Lab Results  Component Value Date   WBC 8.7 05/09/2016   HGB 12.8 (L) 05/09/2016   HCT 37.7 (L) 05/09/2016   MCV 90.0 05/09/2016   PLT 189 05/09/2016   No results for input(s): NA, K, CL, CO2, BUN, CREATININE, CALCIUM, PROT, BILITOT, ALKPHOS, ALT, AST, GLUCOSE in the last 168 hours.  Invalid input(s): LABALBU No results found for: CHOL, HDL, LDLCALC, TRIG No results found for: DDIMER   Radiology/Studies  No results found.  Echo 11/21/2015 Study Conclusions  - Left ventricle: The cavity size was normal. Wall thickness was   increased in a pattern of severe LVH. Systolic function was   mildly to moderately reduced. The estimated ejection fraction was   in the range of 40% to 45%. Diffuse hypokinesis. - Aortic valve: Leaflets are heavily calcified and have reduced   opening. The degree of AS may be underestimated by the current   measurements. Valve mobility was mildly restricted. Valve area   (VTI): 1.85 cm^2. Valve area (Vmax): 1.84 cm^2. Valve area   (Vmean): 1.65 cm^2. - Aortic root: The aortic root was mildly  dilated. - Mitral valve: There was moderate regurgitation. - Left atrium: The atrium was severely dilated. - Right atrium: The atrium was moderately dilated. - Pulmonary arteries: Systolic pressure was moderately increased.   PA peak pressure: 50 mm Hg (S).  ASSESSMENT AND PLAN  1. Acute on chronic diastolic CHF - He has failed outpatient therapy. Dr. Loletha Grayer suspects primarily diastolic dysfunction, possible restrictive/infiltrative cardiomyopathy in this elderly man with severe left atrial dilatation and relatively low voltage on ECG, possible amyloidosis. - Baseline weight around 194lb. Will get BNP, CMP. Start IV lasix 80mg . May consider Bumex at discharge for better absorption. Strict I & O and daily weight.   2. CKD stage III/IV - Followed by Dr. Posey Pronto. Missed appointment yesterday as Dr. Posey Pronto has to leave emergently  per daughter. Baseline creatinine of around 2.0-2.2. Follow closely with diuresis. Will get nephrology consult.   3. Permanent atrial fibrillation - CHADSVASc score of 4.  Anticoagulation stopped due to frequent falls and injuries. No neurological events or other embolic complications.  4. AS - Mild on echo   5. Hypertension - Stable  Signed, Bhagat,Bhavinkumar, PA-C 12/19/2016, 3:45 PM Pager [phone]  Pt seen and examined  Reviewed case with B Bhagat  Agree with assessment as noted. Pt just seen by M Croituru on 3/20  Wt up  Edema worse.  Lasix was increased  Referred to renal  Not seen yesterday Pt presents for further care  On exam, Pt rel comfortable in bed  Neck with mild JVD  Lungs with rales at the L base  Cardiac exam :  RRR  No S3  Abdomen is sl distended  Ext with 2+ edema Labs signif for Cr  2.2   Will give IV lasix 80 now and follow response  Will ask renal to see pt as well since had appt this week and unfort was not seen  Follow HR on tele  Not a candidate for anticoagulation due to falls.  Dorris Carnes

## 2016-12-19 NOTE — Telephone Encounter (Signed)
New message    Pt daughter is calling stating that she did not take her father to the hospital. She said she did not know what to tell them last night. She said her father has only lost two pounds since they saw Dr. Loletha Grayer on Tuesday. She said she needs to know what to do from here. If he needs to be admitted she said they will go to the hospital.

## 2016-12-19 NOTE — Telephone Encounter (Signed)
Ok for direct admit per Dr C for CHF in the [facility] unit on #E  Daughter rebecca notified to go now to the [facility]	[place] admitting for admission to [facility]

## 2016-12-20 DIAGNOSIS — I472 Ventricular tachycardia: Secondary | ICD-10-CM | POA: Diagnosis present

## 2016-12-20 DIAGNOSIS — I5043 Acute on chronic combined systolic (congestive) and diastolic (congestive) heart failure: Secondary | ICD-10-CM | POA: Diagnosis present

## 2016-12-20 DIAGNOSIS — Z79899 Other long term (current) drug therapy: Secondary | ICD-10-CM | POA: Diagnosis not present

## 2016-12-20 DIAGNOSIS — R2689 Other abnormalities of gait and mobility: Secondary | ICD-10-CM | POA: Diagnosis not present

## 2016-12-20 DIAGNOSIS — I130 Hypertensive heart and chronic kidney disease with heart failure and stage 1 through stage 4 chronic kidney disease, or unspecified chronic kidney disease: Secondary | ICD-10-CM | POA: Diagnosis present

## 2016-12-20 DIAGNOSIS — M1990 Unspecified osteoarthritis, unspecified site: Secondary | ICD-10-CM | POA: Diagnosis present

## 2016-12-20 DIAGNOSIS — N179 Acute kidney failure, unspecified: Secondary | ICD-10-CM | POA: Diagnosis not present

## 2016-12-20 DIAGNOSIS — I350 Nonrheumatic aortic (valve) stenosis: Secondary | ICD-10-CM | POA: Diagnosis not present

## 2016-12-20 DIAGNOSIS — I272 Pulmonary hypertension, unspecified: Secondary | ICD-10-CM | POA: Diagnosis not present

## 2016-12-20 DIAGNOSIS — Z87891 Personal history of nicotine dependence: Secondary | ICD-10-CM | POA: Diagnosis not present

## 2016-12-20 DIAGNOSIS — N183 Chronic kidney disease, stage 3 (moderate): Secondary | ICD-10-CM | POA: Diagnosis not present

## 2016-12-20 DIAGNOSIS — Z96653 Presence of artificial knee joint, bilateral: Secondary | ICD-10-CM | POA: Diagnosis present

## 2016-12-20 DIAGNOSIS — I10 Essential (primary) hypertension: Secondary | ICD-10-CM | POA: Diagnosis not present

## 2016-12-20 DIAGNOSIS — Z880 Allergy status to penicillin: Secondary | ICD-10-CM | POA: Diagnosis not present

## 2016-12-20 DIAGNOSIS — R278 Other lack of coordination: Secondary | ICD-10-CM | POA: Diagnosis not present

## 2016-12-20 DIAGNOSIS — I493 Ventricular premature depolarization: Secondary | ICD-10-CM | POA: Diagnosis present

## 2016-12-20 DIAGNOSIS — N184 Chronic kidney disease, stage 4 (severe): Secondary | ICD-10-CM | POA: Diagnosis not present

## 2016-12-20 DIAGNOSIS — Z9181 History of falling: Secondary | ICD-10-CM | POA: Diagnosis not present

## 2016-12-20 DIAGNOSIS — E785 Hyperlipidemia, unspecified: Secondary | ICD-10-CM | POA: Diagnosis not present

## 2016-12-20 DIAGNOSIS — I5033 Acute on chronic diastolic (congestive) heart failure: Secondary | ICD-10-CM | POA: Diagnosis not present

## 2016-12-20 DIAGNOSIS — J80 Acute respiratory distress syndrome: Secondary | ICD-10-CM | POA: Diagnosis not present

## 2016-12-20 DIAGNOSIS — Z7982 Long term (current) use of aspirin: Secondary | ICD-10-CM | POA: Diagnosis not present

## 2016-12-20 DIAGNOSIS — I482 Chronic atrial fibrillation: Secondary | ICD-10-CM | POA: Diagnosis present

## 2016-12-20 DIAGNOSIS — E876 Hypokalemia: Secondary | ICD-10-CM | POA: Diagnosis present

## 2016-12-20 DIAGNOSIS — I481 Persistent atrial fibrillation: Secondary | ICD-10-CM | POA: Diagnosis not present

## 2016-12-20 DIAGNOSIS — Z883 Allergy status to other anti-infective agents status: Secondary | ICD-10-CM | POA: Diagnosis not present

## 2016-12-20 DIAGNOSIS — Z66 Do not resuscitate: Secondary | ICD-10-CM | POA: Diagnosis present

## 2016-12-20 DIAGNOSIS — E8770 Fluid overload, unspecified: Secondary | ICD-10-CM | POA: Diagnosis not present

## 2016-12-20 DIAGNOSIS — I509 Heart failure, unspecified: Secondary | ICD-10-CM | POA: Diagnosis not present

## 2016-12-20 LAB — BASIC METABOLIC PANEL
Anion gap: 16 — ABNORMAL HIGH (ref 5–15)
BUN: 95 mg/dL — AB (ref 6–20)
CHLORIDE: 90 mmol/L — AB (ref 101–111)
CO2: 32 mmol/L (ref 22–32)
Calcium: 9.2 mg/dL (ref 8.9–10.3)
Creatinine, Ser: 2.07 mg/dL — ABNORMAL HIGH (ref 0.61–1.24)
GFR calc Af Amer: 30 mL/min — ABNORMAL LOW (ref >60)
GFR, EST NON AFRICAN AMERICAN: 26 mL/min — AB (ref >60)
GLUCOSE: 103 mg/dL — AB (ref 65–99)
POTASSIUM: 2.9 mmol/L — AB (ref 3.5–5.1)
Sodium: 138 mmol/L (ref 135–145)

## 2016-12-20 LAB — CBC
HEMATOCRIT: 40.6 % (ref 39.0–52.0)
Hemoglobin: 13.3 g/dL (ref 13.0–17.0)
MCH: 30.9 pg (ref 26.0–34.0)
MCHC: 32.8 g/dL (ref 30.0–36.0)
MCV: 94.4 fL (ref 78.0–100.0)
Platelets: 149 10*3/uL — ABNORMAL LOW (ref 150–400)
RBC: 4.3 MIL/uL (ref 4.22–5.81)
RDW: 14.4 % (ref 11.5–15.5)
WBC: 7.8 10*3/uL (ref 4.0–10.5)

## 2016-12-20 MED ORDER — POTASSIUM CHLORIDE CRYS ER 20 MEQ PO TBCR
40.0000 meq | EXTENDED_RELEASE_TABLET | Freq: Once | ORAL | Status: AC
  Administered 2016-12-20: 40 meq via ORAL
  Filled 2016-12-20: qty 2

## 2016-12-20 NOTE — Progress Notes (Signed)
Progress Note  Patient Name: Wesley [name] Date of Encounter: 12/20/2016  Primary Cardiologist: Dr. Sallyanne Kuster  Subjective   He is breathing OK but did not sleep last night.  No pain  Inpatient Medications    Scheduled Meds: . aspirin EC  81 mg Oral Daily  . brimonidine  1 drop Both Eyes Daily  . furosemide  80 mg Intravenous BID  . heparin  5,000 Units Subcutaneous Q8H  . latanoprost  1 drop Both Eyes QHS  . metolazone  5 mg Oral Daily  . potassium chloride  20 mEq Oral BID  . tamsulosin  0.4 mg Oral Daily  . vitamin C  500 mg Oral Daily   Continuous Infusions:  PRN Meds: acetaminophen, nitroGLYCERIN, ondansetron (ZOFRAN) IV   Vital Signs    Vitals:   12/19/16 1534 12/19/16 1947 12/20/16 0418  BP: 121/67 114/62 132/68  Pulse: (!) 108 (!) 56 60  Resp: 18 16 16   Temp: 98 F (36.7 C) 97.6 F (36.4 C) 97.4 F (36.3 C)  TempSrc: Oral Oral Oral  SpO2: 95% 96% 91%  Weight: 206 lb 6.4 oz (93.6 kg)    Height: 5\' 6"  (1.676 m)      Intake/Output Summary (Last 24 hours) at 12/20/16 0736 Last data filed at 12/20/16 0700  Gross per 24 hour  Intake              120 ml  Output              950 ml  Net             -830 ml   Filed Weights   12/19/16 1534  Weight: 206 lb 6.4 oz (93.6 kg)    Telemetry    Atrial fib, PVCs - Personally Reviewed  ECG    NA - Personally Reviewed  Physical Exam   GEN: No acute distress.   Neck: Mild JVD Cardiac: Irregular RR, no murmurs, rubs, or gallops.  Respiratory: Clear to auscultation bilaterally. GI: Soft, nontender, non-distended  MS: Mild edema; No deformity. Neuro:  Nonfocal  Psych: Normal affect   Labs    Chemistry Recent Labs Lab 12/19/16 1808 12/20/16 0418  NA 135 138  K 3.6 2.9*  CL 88* 90*  CO2 33* 32  GLUCOSE 109* 103*  BUN 99* 95*  CREATININE 2.25* 2.07*  CALCIUM 9.2 9.2  PROT 5.9*  --   ALBUMIN 3.6  --   AST 51*  --   ALT 30  --   ALKPHOS 70  --   BILITOT 1.5*  --   GFRNONAA 23* 26*  GFRAA  27* 30*  ANIONGAP 14 16*     Hematology Recent Labs Lab 12/19/16 1808 12/20/16 0418  WBC 9.0 7.8  RBC 4.42 4.30  HGB 14.0 13.3  HCT 41.6 40.6  MCV 94.1 94.4  MCH 31.7 30.9  MCHC 33.7 32.8  RDW 14.4 14.4  PLT 151 149*    Cardiac EnzymesNo results for input(s): TROPONINI in the last 168 hours. No results for input(s): TROPIPOC in the last 168 hours.   BNP Recent Labs Lab 12/19/16 1808  BNP 735.8*     DDimer No results for input(s): DDIMER in the last 168 hours.   Radiology    No results found.  Cardiac Studies   NA  Patient Profile     83 y.o. 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   Assessment & Plan    ATRIAL FIB:  Rate controlled as above.  Not on anticoagulation secondary to falls.  Continue current meds  ACUTE ON CHRONIC SYSTOLIC AND DIASTOLIC HF:  -761 since admission.  Continue current diuresis.    CKD (STAGE III):  Nephrology consult note reviewed.  Foley placed .  Creat is stable.  Follow  HTN:    BP is well controlled on current meds.   HYPOKALEMIA:  Potassium replaced.  I will give a total of 60 meq today.      Signed, Minus Breeding, MD  12/20/2016, 7:36 AM

## 2016-12-20 NOTE — Progress Notes (Signed)
Subjective:  950 of urine- more out than in- creatinine actually better Objective Vital signs in last 24 hours: Vitals:   12/19/16 1534 12/19/16 1947 12/20/16 0418  BP: 121/67 114/62 132/68  Pulse: (!) 108 (!) 56 60  Resp: 18 16 16   Temp: 98 F (36.7 C) 97.6 F (36.4 C) 97.4 F (36.3 C)  TempSrc: Oral Oral Oral  SpO2: 95% 96% 91%  Weight: 93.6 kg (206 lb 6.4 oz)    Height: 5\' 6"  (1.676 m)     Weight change:   Intake/Output Summary (Last 24 hours) at 12/20/16 0277 Last data filed at 12/20/16 0700  Gross per 24 hour  Intake              120 ml  Output              950 ml  Net             -830 ml    Assessment/Plan: 83 year old white male with history of heart failure as well as CKD- patient now admitted with worsening volume. Last creatinine might be slightly worse than baseline. 1.Renal- patient with advanced CKD at baseline. Creatinine better today. Patient also takes Flomax so wonder if there is a bladder outlet obstruction component. Placed Foley catheter to not only keep track of urine output but also alleviate that if it is an issue. Patient is not a candidate for chronic dialysis and is decided against it as well. 2. Hypertension/volume  - blood pressure reasonable on no BP meds other than Flomax. Has been ordered IV Lasix 80 mg twice a day we will add metolazone as well as he takes at home at least twice a week and has been taking it lately daily- - adequate response so far- no change 3. Hypokalemia- agree with repletion    Jaeleah Smyser A    Labs: Basic Metabolic Panel:  Recent Labs Lab 12/19/16 1808 12/20/16 0418  NA 135 138  K 3.6 2.9*  CL 88* 90*  CO2 33* 32  GLUCOSE 109* 103*  BUN 99* 95*  CREATININE 2.25* 2.07*  CALCIUM 9.2 9.2   Liver Function Tests:  Recent Labs Lab 12/19/16 1808  AST 51*  ALT 30  ALKPHOS 70  BILITOT 1.5*  PROT 5.9*  ALBUMIN 3.6   No results for input(s): LIPASE, AMYLASE in the last 168 hours. No results for  input(s): AMMONIA in the last 168 hours. CBC:  Recent Labs Lab 12/19/16 1808 12/20/16 0418  WBC 9.0 7.8  HGB 14.0 13.3  HCT 41.6 40.6  MCV 94.1 94.4  PLT 151 149*   Cardiac Enzymes: No results for input(s): CKTOTAL, CKMB, CKMBINDEX, TROPONINI in the last 168 hours. CBG: No results for input(s): GLUCAP in the last 168 hours.  Iron Studies: No results for input(s): IRON, TIBC, TRANSFERRIN, FERRITIN in the last 72 hours. Studies/Results: No results found. Medications: Infusions:   Scheduled Medications: . aspirin EC  81 mg Oral Daily  . brimonidine  1 drop Both Eyes Daily  . furosemide  80 mg Intravenous BID  . heparin  5,000 Units Subcutaneous Q8H  . latanoprost  1 drop Both Eyes QHS  . metolazone  5 mg Oral Daily  . potassium chloride  20 mEq Oral BID  . potassium chloride  40 mEq Oral Once  . tamsulosin  0.4 mg Oral Daily  . vitamin C  500 mg Oral Daily    have reviewed scheduled and prn medications.  Physical Exam: General: NAD- HOH Heart: RRR  Lungs: dec BS at bases Abdomen: soft,  Non tender Extremities: pitting edema    12/20/2016,9:38 AM  LOS: 1 day

## 2016-12-21 LAB — BASIC METABOLIC PANEL
Anion gap: 11 (ref 5–15)
BUN: 92 mg/dL — AB (ref 6–20)
CHLORIDE: 90 mmol/L — AB (ref 101–111)
CO2: 34 mmol/L — ABNORMAL HIGH (ref 22–32)
CREATININE: 2.13 mg/dL — AB (ref 0.61–1.24)
Calcium: 9.1 mg/dL (ref 8.9–10.3)
GFR calc Af Amer: 29 mL/min — ABNORMAL LOW (ref >60)
GFR, EST NON AFRICAN AMERICAN: 25 mL/min — AB (ref >60)
GLUCOSE: 117 mg/dL — AB (ref 65–99)
Potassium: 3.1 mmol/L — ABNORMAL LOW (ref 3.5–5.1)
Sodium: 135 mmol/L (ref 135–145)

## 2016-12-21 LAB — RENAL FUNCTION PANEL
Albumin: 3.3 g/dL — ABNORMAL LOW (ref 3.5–5.0)
Anion gap: 15 (ref 5–15)
BUN: 97 mg/dL — ABNORMAL HIGH (ref 6–20)
CALCIUM: 9.1 mg/dL (ref 8.9–10.3)
CO2: 33 mmol/L — ABNORMAL HIGH (ref 22–32)
Chloride: 87 mmol/L — ABNORMAL LOW (ref 101–111)
Creatinine, Ser: 2.07 mg/dL — ABNORMAL HIGH (ref 0.61–1.24)
GFR calc Af Amer: 30 mL/min — ABNORMAL LOW (ref >60)
GFR calc non Af Amer: 26 mL/min — ABNORMAL LOW (ref >60)
GLUCOSE: 98 mg/dL (ref 65–99)
Phosphorus: 4.1 mg/dL (ref 2.5–4.6)
Potassium: 3 mmol/L — ABNORMAL LOW (ref 3.5–5.1)
SODIUM: 135 mmol/L (ref 135–145)

## 2016-12-21 MED ORDER — POTASSIUM CHLORIDE CRYS ER 20 MEQ PO TBCR
20.0000 meq | EXTENDED_RELEASE_TABLET | Freq: Every day | ORAL | Status: DC
  Administered 2016-12-21: 20 meq via ORAL
  Filled 2016-12-21: qty 1

## 2016-12-21 MED ORDER — POTASSIUM CHLORIDE CRYS ER 10 MEQ PO TBCR
40.0000 meq | EXTENDED_RELEASE_TABLET | Freq: Once | ORAL | Status: DC
  Filled 2016-12-21 (×2): qty 4

## 2016-12-21 MED ORDER — SODIUM CHLORIDE 0.9 % IV SOLN
30.0000 meq | Freq: Once | INTRAVENOUS | Status: AC
  Administered 2016-12-21: 30 meq via INTRAVENOUS
  Filled 2016-12-21: qty 15

## 2016-12-21 MED ORDER — POTASSIUM CHLORIDE CRYS ER 20 MEQ PO TBCR
40.0000 meq | EXTENDED_RELEASE_TABLET | Freq: Two times a day (BID) | ORAL | Status: AC
  Administered 2016-12-21 (×2): 40 meq via ORAL
  Filled 2016-12-21 (×2): qty 2

## 2016-12-21 NOTE — Progress Notes (Deleted)
Pt had a run of 17 beat of V-tach no s/s, MD notified, will continue to monitor, thanks Arvella Nigh RN

## 2016-12-21 NOTE — Progress Notes (Deleted)
Pt was asking for Xanax 1mg  that he takes at home for anxiety, will continue to monitor, Thanks

## 2016-12-21 NOTE — Progress Notes (Signed)
Subjective:  3300 of urine- - creatinine stable Objective Vital signs in last 24 hours: Vitals:   12/20/16 0418 12/20/16 1315 12/20/16 2118 12/21/16 0455  BP: 132/68 95/60 103/69 113/65  Pulse: 60 62 78 68  Resp: 16 18 20 18   Temp: 97.4 F (36.3 C) 97.3 F (36.3 C) 97.9 F (36.6 C) 97.5 F (36.4 C)  TempSrc: Oral Oral Oral Oral  SpO2: 91% 95% 95% 94%  Weight:    90.4 kg (199 lb 6.4 oz)  Height:       Weight change: -3.175 kg (-7 lb)  Intake/Output Summary (Last 24 hours) at 12/21/16 1011 Last data filed at 12/21/16 0700  Gross per 24 hour  Intake              925 ml  Output             2400 ml  Net            -1475 ml    Assessment/Plan: 83 year old white male with history of heart failure as well as CKD- patient now admitted with worsening volume. Last creatinine might be slightly worse than baseline. 1.Renal- patient with advanced CKD at baseline. Creatinine better today. Patient also takes Flomax so wonder if there is a bladder outlet obstruction component. Placed Foley catheter to not only keep track of urine output but also alleviate that if it is an issue. Patient is not a candidate for chronic dialysis and has decided against it as well. 2. Hypertension/volume  - blood pressure reasonable on no BP meds other than Flomax. Has been ordered IV Lasix 80 mg twice a day we will add metolazone as well as he takes at home  lately daily- - adequate response so far- no change 3. Hypokalemia- agree with repletion- has been increased     Wesley [name] A    Labs: Basic Metabolic Panel:  Recent Labs Lab 12/19/16 1808 12/20/16 0418 12/21/16 0418  NA 135 138 135  K 3.6 2.9* 3.0*  CL 88* 90* 87*  CO2 33* 32 33*  GLUCOSE 109* 103* 98  BUN 99* 95* 97*  CREATININE 2.25* 2.07* 2.07*  CALCIUM 9.2 9.2 9.1  PHOS  --   --  4.1   Liver Function Tests:  Recent Labs Lab 12/19/16 1808 12/21/16 0418  AST 51*  --   ALT 30  --   ALKPHOS 70  --   BILITOT 1.5*  --   PROT  5.9*  --   ALBUMIN 3.6 3.3*   No results for input(s): LIPASE, AMYLASE in the last 168 hours. No results for input(s): AMMONIA in the last 168 hours. CBC:  Recent Labs Lab 12/19/16 1808 12/20/16 0418  WBC 9.0 7.8  HGB 14.0 13.3  HCT 41.6 40.6  MCV 94.1 94.4  PLT 151 149*   Cardiac Enzymes: No results for input(s): CKTOTAL, CKMB, CKMBINDEX, TROPONINI in the last 168 hours. CBG: No results for input(s): GLUCAP in the last 168 hours.  Iron Studies: No results for input(s): IRON, TIBC, TRANSFERRIN, FERRITIN in the last 72 hours. Studies/Results: No results found. Medications: Infusions:   Scheduled Medications: . aspirin EC  81 mg Oral Daily  . brimonidine  1 drop Both Eyes Daily  . furosemide  80 mg Intravenous BID  . heparin  5,000 Units Subcutaneous Q8H  . latanoprost  1 drop Both Eyes QHS  . metolazone  5 mg Oral Daily  . potassium chloride  20 mEq Oral Daily  . potassium chloride  40  mEq Oral BID  . tamsulosin  0.4 mg Oral Daily  . vitamin C  500 mg Oral Daily    have reviewed scheduled and prn medications.  Physical Exam: General: NAD- HOH Heart: RRR Lungs: dec BS at bases Abdomen: soft,  Non tender Extremities: pitting edema    12/21/2016,10:11 AM  LOS: 2 days

## 2016-12-21 NOTE — Progress Notes (Signed)
Progress Note  Patient Name: Wesley [name] Date of Encounter: 12/21/2016  Primary Cardiologist: Dr. Sallyanne Kuster  Subjective   He is breathing OK but did not sleep last night.  "I am wore out"  Inpatient Medications    Scheduled Meds: . aspirin EC  81 mg Oral Daily  . brimonidine  1 drop Both Eyes Daily  . furosemide  80 mg Intravenous BID  . heparin  5,000 Units Subcutaneous Q8H  . latanoprost  1 drop Both Eyes QHS  . metolazone  5 mg Oral Daily  . potassium chloride  20 mEq Oral BID  . tamsulosin  0.4 mg Oral Daily  . vitamin C  500 mg Oral Daily   Continuous Infusions:  PRN Meds: acetaminophen, nitroGLYCERIN, ondansetron (ZOFRAN) IV   Vital Signs    Vitals:   12/20/16 0418 12/20/16 1315 12/20/16 2118 12/21/16 0455  BP: 132/68 95/60 103/69 113/65  Pulse: 60 62 78 68  Resp: 16 18 20 18   Temp: 97.4 F (36.3 C) 97.3 F (36.3 C) 97.9 F (36.6 C) 97.5 F (36.4 C)  TempSrc: Oral Oral Oral Oral  SpO2: 91% 95% 95% 94%  Weight:    199 lb 6.4 oz (90.4 kg)  Height:        Intake/Output Summary (Last 24 hours) at 12/21/16 0739 Last data filed at 12/21/16 0454  Gross per 24 hour  Intake             1145 ml  Output             3300 ml  Net            -2155 ml   Filed Weights   12/19/16 1534 12/21/16 0455  Weight: 206 lb 6.4 oz (93.6 kg) 199 lb 6.4 oz (90.4 kg)    Telemetry    Atrial fib, PVCs, run of NSVT - Personally Reviewed  ECG    NA - Personally Reviewed  Physical Exam   GEN: No acute distress.  Sitting in chair Neck: No JVD Cardiac: Irregular RR, no murmurs, rubs.  Respiratory: Clear to auscultation bilaterally no crackles. GI: Soft, nontender, non-distended  MS: Mild edema to mid thigh; No deformity. Neuro:  Nonfocal  Psych: Normal affect   Labs    Chemistry  Recent Labs Lab 12/19/16 1808 12/20/16 0418 12/21/16 0418  NA 135 138 135  K 3.6 2.9* 3.0*  CL 88* 90* 87*  CO2 33* 32 33*  GLUCOSE 109* 103* 98  BUN 99* 95* 97*  CREATININE  2.25* 2.07* 2.07*  CALCIUM 9.2 9.2 9.1  PROT 5.9*  --   --   ALBUMIN 3.6  --  3.3*  AST 51*  --   --   ALT 30  --   --   ALKPHOS 70  --   --   BILITOT 1.5*  --   --   GFRNONAA 23* 26* 26*  GFRAA 27* 30* 30*  ANIONGAP 14 16* 15     Hematology  Recent Labs Lab 12/19/16 1808 12/20/16 0418  WBC 9.0 7.8  RBC 4.42 4.30  HGB 14.0 13.3  HCT 41.6 40.6  MCV 94.1 94.4  MCH 31.7 30.9  MCHC 33.7 32.8  RDW 14.4 14.4  PLT 151 149*    Cardiac EnzymesNo results for input(s): TROPONINI in the last 168 hours. No results for input(s): TROPIPOC in the last 168 hours.   BNP  Recent Labs Lab 12/19/16 1808  BNP 735.8*     DDimer No  results for input(s): DDIMER in the last 168 hours.   Radiology    No results found.  Cardiac Studies   NA  Patient Profile     83 y.o. 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   Assessment & Plan    ATRIAL FIB:  Rate controlled as above.  Not on anticoagulation secondary to falls.  Continue current meds.   Frequent ectopy noted.   ACUTE ON CHRONIC SYSTOLIC AND DIASTOLIC HF:  4818 net negative since admission.  Continue current diuresis.     NSVT:    Supplement potassium as below.    CKD (STAGE III):   Creat is stable.  No change in therapy.    HTN:    BP is well controlled on current meds.  Continue.    HYPOKALEMIA:  I gave a total of 60 meq yesterday of Kdur.  He will need 100 today.   This dose will need to be adjusted tomorrow.  I wrote the 40 bid as one day only.      Signed, Minus Breeding, MD  12/21/2016, 7:39 AM

## 2016-12-22 LAB — BASIC METABOLIC PANEL
Anion gap: 15 (ref 5–15)
BUN: 91 mg/dL — AB (ref 6–20)
CALCIUM: 9.2 mg/dL (ref 8.9–10.3)
CO2: 33 mmol/L — ABNORMAL HIGH (ref 22–32)
CREATININE: 2.08 mg/dL — AB (ref 0.61–1.24)
Chloride: 89 mmol/L — ABNORMAL LOW (ref 101–111)
GFR calc Af Amer: 30 mL/min — ABNORMAL LOW (ref >60)
GFR calc non Af Amer: 26 mL/min — ABNORMAL LOW (ref >60)
Glucose, Bld: 100 mg/dL — ABNORMAL HIGH (ref 65–99)
Potassium: 3.2 mmol/L — ABNORMAL LOW (ref 3.5–5.1)
Sodium: 137 mmol/L (ref 135–145)

## 2016-12-22 LAB — MAGNESIUM: Magnesium: 2.3 mg/dL (ref 1.7–2.4)

## 2016-12-22 MED ORDER — FUROSEMIDE 80 MG PO TABS
80.0000 mg | ORAL_TABLET | Freq: Two times a day (BID) | ORAL | Status: DC
  Administered 2016-12-22 – 2016-12-25 (×6): 80 mg via ORAL
  Filled 2016-12-22 (×6): qty 1

## 2016-12-22 MED ORDER — POTASSIUM CHLORIDE CRYS ER 20 MEQ PO TBCR
40.0000 meq | EXTENDED_RELEASE_TABLET | Freq: Every day | ORAL | Status: DC
  Administered 2016-12-22: 40 meq via ORAL
  Filled 2016-12-22: qty 2

## 2016-12-22 NOTE — Progress Notes (Signed)
[facility] Rounding Note  Subjective:   Wants to go home Can't sleep here Weight down about 5 kg if believable (and I think is - since supported by matching intake/output records)  Objective Vital signs in last 24 hours: Vitals:   12/21/16 2123 12/22/16 0656 12/22/16 1033 12/22/16 1310  BP:  130/60 107/70 110/76  Pulse:  79 99 82  Resp: 18 18  18   Temp: 97.9 F (36.6 C) 98.5 F (36.9 C)  97.8 F (36.6 C)  TempSrc: Oral Oral  Oral  SpO2: 94% 96%  96%  Weight:  88.5 kg (195 lb 1.6 oz)    Height:       Weight change: -1.95 kg (-4 lb 4.8 oz)  Intake/Output Summary (Last 24 hours) at 12/22/16 1629 Last data filed at 12/22/16 1526  Gross per 24 hour  Intake              940 ml  Output             1970 ml  Net            -1030 ml   Physical Exam: Elderly male, NAD, hard of hearing + JVD Few base crackles, O/W clear Normal heart sounds, irregular rhythm 1-2+ pitting edema LE's   Recent Labs Lab 12/21/16 0418 12/21/16 1121 12/22/16 0749  NA 135 135 137  K 3.0* 3.1* 3.2*  CL 87* 90* 89*  CO2 33* 34* 33*  GLUCOSE 98 117* 100*  BUN 97* 92* 91*  CREATININE 2.07* 2.13* 2.08*  CALCIUM 9.1 9.1 9.2  PHOS 4.1  --   --      Recent Labs Lab 12/19/16 1808 12/21/16 0418  AST 51*  --   ALT 30  --   ALKPHOS 70  --   BILITOT 1.5*  --   PROT 5.9*  --   ALBUMIN 3.6 3.3*    Recent Labs Lab 12/19/16 1808 12/20/16 0418  WBC 9.0 7.8  HGB 14.0 13.3  HCT 41.6 40.6  MCV 94.1 94.4  PLT 151 149*    Scheduled Medications: . aspirin EC  81 mg Oral Daily  . brimonidine  1 drop Both Eyes Daily  . furosemide  80 mg Oral BID  . heparin  5,000 Units Subcutaneous Q8H  . latanoprost  1 drop Both Eyes QHS  . metolazone  5 mg Oral Daily  . potassium chloride  40 mEq Oral Daily  . tamsulosin  0.4 mg Oral Daily  . vitamin C  500 mg Oral Daily    Assessment/Plan:  83 year old white male with history of heart failure as well as CKD- patient admitted with worsening volume.    1. Renal- patient with advanced CKD at baseline. Creatinine stable right around 2 or so. Diuresing with lasix 80 BID (now po - changed today) plus metolazone.  Patient is not a candidate for chronic dialysis and has decided against it as well. 2. Hypertension/volume  - blood pressure reasonable on no BP meds other than Flomax.  3. Hypokalemia- on oral K supplementation  Given stable creatinine/GFR, good diuresis, no plan for RRT - renal adding little to his care at this time. Will sign off - call if questions.  Jamal Maes, MD [facility] [phone] Pager 12/22/2016, 4:35 PM

## 2016-12-22 NOTE — Progress Notes (Signed)
Page to Dr Sallyanne Kuster to request code status change, per documentation provided by family.

## 2016-12-22 NOTE — Progress Notes (Signed)
Progress Note  Patient Name: Wesley [name] Date of Encounter: 12/22/2016  Primary Cardiologist: Dr. Sallyanne Kuster  Subjective   Patient is feeling well; denies chest pain, SOB, and palpitations. Pt states he can't sleep in the hospital and wants to go home.  Inpatient Medications    Scheduled Meds: . aspirin EC  81 mg Oral Daily  . brimonidine  1 drop Both Eyes Daily  . furosemide  80 mg Intravenous BID  . heparin  5,000 Units Subcutaneous Q8H  . latanoprost  1 drop Both Eyes QHS  . metolazone  5 mg Oral Daily  . potassium chloride  20 mEq Oral Daily  . tamsulosin  0.4 mg Oral Daily  . vitamin C  500 mg Oral Daily   Continuous Infusions:  PRN Meds: acetaminophen, nitroGLYCERIN, ondansetron (ZOFRAN) IV   Vital Signs    Vitals:   12/21/16 0455 12/21/16 1144 12/21/16 2123 12/22/16 0656  BP: 113/65 (!) 108/56 112/77 130/60  Pulse: 68 100 (!) 53 79  Resp: 18 18 18 18   Temp: 97.5 F (36.4 C) 97.1 F (36.2 C) 97.9 F (36.6 C) 98.5 F (36.9 C)  TempSrc: Oral Oral Oral Oral  SpO2: 94% 96% 94% 96%  Weight: 199 lb 6.4 oz (90.4 kg)   195 lb 1.6 oz (88.5 kg)  Height:        Intake/Output Summary (Last 24 hours) at 12/22/16 0812 Last data filed at 12/22/16 0656  Gross per 24 hour  Intake              920 ml  Output             2550 ml  Net            -1630 ml   Filed Weights   12/19/16 1534 12/21/16 0455 12/22/16 0656  Weight: 206 lb 6.4 oz (93.6 kg) 199 lb 6.4 oz (90.4 kg) 195 lb 1.6 oz (88.5 kg)     Physical Exam   General: Well developed, well nourished, male appearing in no acute distress. Head: Normocephalic, atraumatic.  Neck: Supple without bruits, JVD to level of the jaw Lungs:  Resp regular and unlabored, CTA, but scattered crackles in bases. Heart: Irregular rhythm but regular rate, systolic murmur Abdomen: Soft, non-tender, non-distended with normoactive bowel sounds. No hepatomegaly. No rebound/guarding. No obvious abdominal masses. Extremities: No  clubbing, cyanosis, 2+ edema with skin changes from venous stasis. Distal pedal pulses are 1+ bilaterally. Neuro: Alert and oriented X 3. Moves all extremities spontaneously. Hard of hearing Psych: Normal affect.  Labs    Chemistry  Recent Labs Lab 12/19/16 1808 12/20/16 0418 12/21/16 0418 12/21/16 1121  NA 135 138 135 135  K 3.6 2.9* 3.0* 3.1*  CL 88* 90* 87* 90*  CO2 33* 32 33* 34*  GLUCOSE 109* 103* 98 117*  BUN 99* 95* 97* 92*  CREATININE 2.25* 2.07* 2.07* 2.13*  CALCIUM 9.2 9.2 9.1 9.1  PROT 5.9*  --   --   --   ALBUMIN 3.6  --  3.3*  --   AST 51*  --   --   --   ALT 30  --   --   --   ALKPHOS 70  --   --   --   BILITOT 1.5*  --   --   --   GFRNONAA 23* 26* 26* 25*  GFRAA 27* 30* 30* 29*  ANIONGAP 14 16* 15 11     Hematology  Recent Labs Lab  12/19/16 1808 12/20/16 0418  WBC 9.0 7.8  RBC 4.42 4.30  HGB 14.0 13.3  HCT 41.6 40.6  MCV 94.1 94.4  MCH 31.7 30.9  MCHC 33.7 32.8  RDW 14.4 14.4  PLT 151 149*    Cardiac EnzymesNo results for input(s): TROPONINI in the last 168 hours. No results for input(s): TROPIPOC in the last 168 hours.   BNP  Recent Labs Lab 12/19/16 1808  BNP 735.8*     DDimer No results for input(s): DDIMER in the last 168 hours.   Radiology    No results found.   Telemetry    Atrial fibrillation rate controlled, frequent PVCs - Personally Reviewed  ECG    No new tracings - Personally Reviewed   Cardiac Studies   Echocardiogram 11/03/15 Study Conclusions - Left ventricle: The cavity size was normal. Wall thickness was   increased in a pattern of severe LVH. Systolic function was   mildly to moderately reduced. The estimated ejection fraction was   in the range of 40% to 45%. Diffuse hypokinesis. - Aortic valve: Leaflets are heavily calcified and have reduced   opening. The degree of AS may be underestimated by the current   measurements. Valve mobility was mildly restricted. Valve area   (VTI): 1.85 cm^2. Valve  area (Vmax): 1.84 cm^2. Valve area   (Vmean): 1.65 cm^2. - Aortic root: The aortic root was mildly dilated. - Mitral valve: There was moderate regurgitation. - Left atrium: The atrium was severely dilated. - Right atrium: The atrium was moderately dilated. - Pulmonary arteries: Systolic pressure was moderately increased.   PA peak pressure: 50 mm Hg (S).   Myoview 05/17/15  The left ventricular ejection fraction is mildly decreased (45-54%).  Nuclear stress EF: 46%.  There was no ST segment deviation noted during stress.  This is a low risk study.   Low risk stress nuclear study with a small, mild, reversible inferior basal defect of borderline significance (cannot R/O very mild ischemia); otherwise normal perfusion; EF 46 but visually appears better; suggest echo to further assess; mild LVE.    Patient Profile     83 y.o. 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   Assessment & Plan    ATRIAL FIB:  Rate controlled as above.  Not on anticoagulation secondary to falls.  Continue current meds.   Frequent ectopy noted.   ACUTE ON CHRONIC SYSTOLIC AND DIASTOLIC HF: continue diuresis, wt 195 lbs today (206 on admission); last dry weight was re-established at under 200 lbs at clinic on 09/08/16. He is still volume up and would benefit from further diuresis; BMP pending. - currently scheduled for 80 mg IV BID and zaroxolyn today; increased K supplementation to 40 mEq BID, may require more K dur depending on BMP - Given his left ventricle wall thickness in a pattern of severe LVH and low voltage EKG, may consider amyloidosis workup as outpatient.  FREQUENT PVCs: Magnesium pending; K pending; will supplement both after labs finalized    CKD (STAGE III):   Creatinine is stable: 2.13 (2.07). Baseline appears 2.20.  No change in therapy.    HTN:    BP is well controlled on current meds.   Continue.    HYPOKALEMIA: BMP today pending, K yesterday 3.1    Signed, Ledora Bottcher , PA-C 8:12 AM 12/22/2016 Pager: [phone]  I have seen and examined the patient along with Tami Lin  [facility], PA.  I have reviewed the chart, notes and new data.  I agree with PA's note.  Key new complaints: feels better. Unsteady on feet Key examination changes: substantial reduction in edema, but still with 2+ swelling Key new findings / data: K low, creat stable  PLAN: Switch to PO diuretics, watch at least another 24 hours.  Sanda Klein, MD, [place] [phone] 12/22/2016, 12:12 PM

## 2016-12-23 LAB — BASIC METABOLIC PANEL
ANION GAP: 13 (ref 5–15)
BUN: 92 mg/dL — ABNORMAL HIGH (ref 6–20)
CHLORIDE: 87 mmol/L — AB (ref 101–111)
CO2: 37 mmol/L — ABNORMAL HIGH (ref 22–32)
Calcium: 9.3 mg/dL (ref 8.9–10.3)
Creatinine, Ser: 2.11 mg/dL — ABNORMAL HIGH (ref 0.61–1.24)
GFR calc Af Amer: 29 mL/min — ABNORMAL LOW (ref >60)
GFR calc non Af Amer: 25 mL/min — ABNORMAL LOW (ref >60)
Glucose, Bld: 100 mg/dL — ABNORMAL HIGH (ref 65–99)
POTASSIUM: 3.1 mmol/L — AB (ref 3.5–5.1)
SODIUM: 137 mmol/L (ref 135–145)

## 2016-12-23 MED ORDER — POTASSIUM CHLORIDE CRYS ER 20 MEQ PO TBCR
40.0000 meq | EXTENDED_RELEASE_TABLET | Freq: Two times a day (BID) | ORAL | Status: DC
  Administered 2016-12-23 – 2016-12-25 (×5): 40 meq via ORAL
  Filled 2016-12-23 (×5): qty 2

## 2016-12-23 MED ORDER — POTASSIUM CHLORIDE CRYS ER 20 MEQ PO TBCR
40.0000 meq | EXTENDED_RELEASE_TABLET | Freq: Once | ORAL | Status: AC
  Administered 2016-12-23: 40 meq via ORAL
  Filled 2016-12-23: qty 2

## 2016-12-23 NOTE — Progress Notes (Signed)
Patient refused bath this am, patient's family at bedside when pt refused. Agreed pt only needed his bed linens changed.

## 2016-12-23 NOTE — Progress Notes (Signed)
Taught the importance of potassium on the heart

## 2016-12-23 NOTE — Progress Notes (Signed)
Progress Note  Patient Name: Wesley [name] Date of Encounter: 12/23/2016  Primary Cardiologist: Dr. Sallyanne Kuster  Subjective   Patient is feeling well; denies chest pain, SOB, and palpitations. Pt states he slept better last night and the swelling in his legs is about baseline. He wants to discharge home.   Inpatient Medications    Scheduled Meds: . aspirin EC  81 mg Oral Daily  . brimonidine  1 drop Both Eyes Daily  . furosemide  80 mg Oral BID  . heparin  5,000 Units Subcutaneous Q8H  . latanoprost  1 drop Both Eyes QHS  . metolazone  5 mg Oral Daily  . potassium chloride  40 mEq Oral BID  . tamsulosin  0.4 mg Oral Daily  . vitamin C  500 mg Oral Daily   Continuous Infusions:  PRN Meds: acetaminophen, nitroGLYCERIN, ondansetron (ZOFRAN) IV   Vital Signs    Vitals:   12/22/16 1310 12/22/16 1930 12/23/16 0536 12/23/16 0805  BP: 110/76 (!) 116/54 106/67   Pulse: 82 73 63 64  Resp: 18 18 18    Temp: 97.8 F (36.6 C) 97.7 F (36.5 C) 97.7 F (36.5 C) 98.2 F (36.8 C)  TempSrc: Oral Oral Oral Oral  SpO2: 96% 100% 93% 96%  Weight:   195 lb 9.6 oz (88.7 kg)   Height:        Intake/Output Summary (Last 24 hours) at 12/23/16 0806 Last data filed at 12/23/16 0272  Gross per 24 hour  Intake             1440 ml  Output             1990 ml  Net             -550 ml   Filed Weights   12/21/16 0455 12/22/16 0656 12/23/16 0536  Weight: 199 lb 6.4 oz (90.4 kg) 195 lb 1.6 oz (88.5 kg) 195 lb 9.6 oz (88.7 kg)     Physical Exam   General: Well developed, well nourished, male appearing in no acute distress. Head: Normocephalic, atraumatic.  Neck: Supple without bruits, improved JVD. Lungs:  Resp regular and unlabored, CTA, diminished in bases. Heart: Irregular rhythm, regular rate, no murmur; no rub. Abdomen: Soft, non-tender, non-distended with normoactive bowel sounds. No hepatomegaly. No rebound/guarding. No obvious abdominal masses. Extremities: No clubbing,  cyanosis, 2+ edema. Distal pedal pulses are 2+ bilaterally. Neuro: Alert and oriented X 3. Moves all extremities spontaneously. Psych: Normal affect.  Labs    Chemistry Recent Labs Lab 12/19/16 1808  12/21/16 0418 12/21/16 1121 12/22/16 0749 12/23/16 0527  NA 135  < > 135 135 137 137  K 3.6  < > 3.0* 3.1* 3.2* 3.1*  CL 88*  < > 87* 90* 89* 87*  CO2 33*  < > 33* 34* 33* 37*  GLUCOSE 109*  < > 98 117* 100* 100*  BUN 99*  < > 97* 92* 91* 92*  CREATININE 2.25*  < > 2.07* 2.13* 2.08* 2.11*  CALCIUM 9.2  < > 9.1 9.1 9.2 9.3  PROT 5.9*  --   --   --   --   --   ALBUMIN 3.6  --  3.3*  --   --   --   AST 51*  --   --   --   --   --   ALT 30  --   --   --   --   --   ALKPHOS 70  --   --   --   --   --  BILITOT 1.5*  --   --   --   --   --   GFRNONAA 23*  < > 26* 25* 26* 25*  GFRAA 27*  < > 30* 29* 30* 29*  ANIONGAP 14  < > 15 11 15 13   < > = values in this interval not displayed.   Hematology Recent Labs Lab 12/19/16 1808 12/20/16 0418  WBC 9.0 7.8  RBC 4.42 4.30  HGB 14.0 13.3  HCT 41.6 40.6  MCV 94.1 94.4  MCH 31.7 30.9  MCHC 33.7 32.8  RDW 14.4 14.4  PLT 151 149*    Cardiac EnzymesNo results for input(s): TROPONINI in the last 168 hours. No results for input(s): TROPIPOC in the last 168 hours.   BNP Recent Labs Lab 12/19/16 1808  BNP 735.8*     DDimer No results for input(s): DDIMER in the last 168 hours.   Radiology    No results found.   Telemetry    Atrial fibrillation with PVCs, rate controlled in 70s - Personally Reviewed  ECG    No new tracings - Personally Reviewed   Cardiac Studies   Echocardiogram 11/03/15 Study Conclusions - Left ventricle: The cavity size was normal. Wall thickness was increased in a pattern of severe LVH. Systolic function was mildly to moderately reduced. The estimated ejection fraction was in the range of 40% to 45%. Diffuse hypokinesis. - Aortic valve: Leaflets are heavily calcified and have  reduced opening. The degree of AS may be underestimated by the current measurements. Valve mobility was mildly restricted. Valve area (VTI): 1.85 cm^2. Valve area (Vmax): 1.84 cm^2. Valve area (Vmean): 1.65 cm^2. - Aortic root: The aortic root was mildly dilated. - Mitral valve: There was moderate regurgitation. - Left atrium: The atrium was severely dilated. - Right atrium: The atrium was moderately dilated. - Pulmonary arteries: Systolic pressure was moderately increased. PA peak pressure: 50 mm Hg (S).   Myoview 05/17/15  The left ventricular ejection fraction is mildly decreased (45-54%).  Nuclear stress EF: 46%.  There was no ST segment deviation noted during stress.  This is a low risk study.  Low risk stress nuclear study with a small, mild, reversible inferior basal defect of borderline significance (cannot R/O very mild ischemia); otherwise normal perfusion; EF 46 but visually appears better; suggest echo to further assess; mild LVE.    Patient Profile     83 y.o. 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   Assessment & Plan    ATRIAL FIB: Rate controlled as above. Not on anticoagulation secondary to falls. Continue current meds. Frequent ectopy noted.   ACUTE ON CHRONIC SYSTOLIC AND DIASTOLIC HF: continue diuresis, wt 195 lbs today (206 on admission); last dry weight was re-established at under 200 lbs at clinic on 09/08/16. He is overall net negative > 5L with 2L urine output yesteray - Given his left ventricle wall thickness in a pattern of severe LVH and low voltage EKG, may consider amyloidosis workup as outpatient. - transitioned IV lasix to PO; lasix 80 mg BID with zaroxolyn - will likely need to increased lasix and zaroxolyn regimen at discharge  FREQUENT PVCs: Mg WNL; K supplementation   CKD (STAGE III): Creatinine is stable: 2.11  (2.08) . Baseline appears 2.20. No change in therapy.   HTN: BP is well controlled on current meds. Continue.   HYPOKALEMIA: BMP today pending, K yesterday 3.1 - increased  kdur to BID dosing and gave OTO dose of 40 mEq extra  Signed, Ledora Bottcher , PA-C 8:06 AM 12/23/2016 Pager: [phone]  I have seen and examined the patient along with Ledora Bottcher, PA.  I have reviewed the chart, notes and new data.  I agree with PA's note.  Key new complaints: breathing well, a little restless. Feet are sore, but not severe pain or swelling like past gout. Required assistance of 2 people to stand up this morning Key examination changes: edema is greatly improved, especially after he elevates legs. Lungs are clear. Key new findings / data: K still very low.  PLAN: Seems to be at target weight with diuresis and holding steady after PO transition. At this point, biggest problem is deconditioning, ability to safely return home. Will work with PT. Hopefully can strengthen to point that he can go home with home health services for CHF and home PT. If not, may have to consider short term NH/SNF.  Sanda Klein, MD, [place] [phone] 12/23/2016, 11:30 AM

## 2016-12-23 NOTE — Care Management Note (Signed)
Case Management Note  Patient Details  Name: Wesley [name] MRN: [id number] Date of Birth: 04-Apr-1921  Subjective/Objective:           Spoke with patient's daughter at the bedside. She states patient lives at home alone, she checks on him daily. He has some assistive equipment in bathroom, has a rolator, tub chair, sleeps in a recliner, spends most of the day in recliner. She states he refuses to go to ALF, SNF, or have any additional people in the home to care for him. He spends most of day at home alone. Daughter provides hot meals daily, makes sure he baths, and arranges to have house cleaned twice a month. She gets his meds and takes him to [facility] apts. Daughter states she is interested in home health PT, not nursing at this time. She has used [facility] in the past, and would like touse them again. She states when she spoke with MD this AM, she thought he was going to place PT order. CM will follow up with RN staff.          Action/Plan:  Comntinue to follow may benefit from PT eval and home PT.   Expected Discharge Date:  12/22/16               Expected Discharge Plan:  [place]  In-House Referral:     Discharge planning Services  CM Consult  Post Acute Care Choice:    Choice offered to:     DME Arranged:    DME Agency:     HH Arranged:    HH Agency:     Status of Service:  In process, will continue to follow  If discussed at Long Length of Stay Meetings, dates discussed:    Additional Comments:  Carles Collet, RN 12/23/2016, 12:56 PM

## 2016-12-23 NOTE — Consult Note (Signed)
   [facility] Inpatient Consult   12/23/2016  Wesley [name] Jan 21, 1921 [id number]  Patient was referred by inpatient RNCM, Carles Collet in progression for potential transportation and community support needs.  Chart review reveals the patient is Wesley [name] is a 83 y.o. 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 Met with patient and Wesley [name] at the bedside.  Wesley [name] states the daughter is coming and he would like her to hear about the referral.  Left the [facility] brochure and contact information.  Patient had been seen by [place] [facility] nurse in late 2017 at the time the patient and daughter felt there were no ongoing follow up needs.  Will follow up for needs.  For questions, please contact:  Natividad Brood, RN BSN [facility] Liaison  [phone] business mobile phone Toll free office [phone]  1045 am:  Met with patient, Wesley [name] and daughter, Wesley [name] at the bedside.  Patient was nodding in and out of conversation but pleasantly accepted the interaction with his daughter.  Daughter states that currently they are not having any issues with transportation.  She states he had been doing pretty well for over a year with the issues of his heart and kidneys.  She states, "there is nothing going on now that we haven't been able to handle.  I do have your brochure and will call if that changes. So far, we have been able to transport him between the two of [place] [facility] and Wesley [name]].  Encouraged her to contact [facility] for community needs as arise and call the nurse advise line if questions arise.  They verbalized they would. No further needs identified. Patient will TOC performed by MD office. Endorses Dr. Deland Pretty as the primary Care provider. No further needs identified at this time. For questions or changes please contact:  Natividad Brood, RN BSN [facility] Liaison  [phone] business mobile phone Toll free office [phone]

## 2016-12-23 NOTE — Discharge Summary (Signed)
Discharge Summary    Patient ID: Wesley [name],  MRN: [id number], DOB/AGE: 1920/10/09 83 y.o.  Admit date: 12/19/2016 Discharge date: 12/25/2016   Primary Care Provider: Merri Brunette DAVIDSON Primary Cardiologist: Dr. Royann Shivers  Discharge Diagnoses    Principal Problem:   Acute on chronic diastolic CHF (congestive heart failure) (HCC) Active Problems:   Essential hypertension   Hyperlipidemia   Chronic renal insufficiency, stage III (moderate)   Aortic stenosis, mild   Pulmonary hypertension- PA 50 mmHg   Persistent atrial fibrillation (HCC)   Allergies Allergies  Allergen Reactions  . Neosporin [Neomycin-Bacitracin Zn-Polymyx] Itching and Rash  . Penicillins Itching and Rash    Has patient had a PCN reaction causing immediate rash, facial/tongue/throat swelling, SOB or lightheadedness with hypotension: Yes Has patient had a PCN reaction causing severe rash involving mucus membranes or skin necrosis: No Has patient had a PCN reaction that required hospitalization No Has patient had a PCN reaction occurring within the last 10 years: Yes If all of the above answers are "NO", then may proceed with Cephalosporin use.     History of Present Illness     Wesley [name] is a 83 y.o. 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   He requires fairly frequent adjustment in diuretic dose due to recurrent problems with edema and dyspnea and concomitant severe renal disease. Despite atrial fibrillation, anticoagulants have been stopped due to very frequent and dangerous falls.  Per note He has severe left ventricular hypertrophy by echo but no significant hypervoltage on his electrocardiogram, raising the possibility of cardiac amyloidosis. His nuclear stress test did not show coronary insufficiency. His echocardiogram performed in February 2017 showed a left ventricular ejection  fraction of 40-45%, comparable to the ejection fraction estimated by nuclear scintigraphy in October 2016 at 46%. The echo again confirmed that his aortic stenosis is very mild. The left atrium is severely dilated. The Doppler parameters suggest that he was volume overloaded (E/e'=14). The systolic PA pressure was estimated at approximately 50 mmHg.  Last seen by Dr. Royann Shivers 12/16/16. He gained 9lb prior to that visit. Increased Furosemide to 80 mg BID and advised to take metolazone daily at least until sees Dr. Allena Katz in a couple of days. However patient missed the appointment. Has has lost 2lb since seen in clinic. However he continued to feel shortness of breath. Mostly with minimal exertion. No orthopnea or PND. Daughter called the office and admitted directly. Declined in urine out put. No further fall. No chest pain, palpitations, dizziness, melena. Has LE edema. Scr of 2.38 on 12/12/16.    Hospital Course     Mr. Shank was admitted for aggressive diuresis. His weight today was 195 lbs (206 lbs on admission). His last dry weight was reestablished as under 200 lbs in clinic (09/08/16). At discharge, he was overall net negative 8L. He was transitioned to 80 mg lasix PO BID. He was discharged on this lasix regimen and zaroxolyn 3 times weekly (M/W/Sat), which is increased from his twice weekly regimen. He will be instructed to take 40 mEq potassium BID daily. I instructed him to weigh daily. If weight is over 200 lbs, may take extra zaroxolyn on T/Th/Sun. He needs a BMP next week for sCr and K.  CKD stage III: His creatinine was stable at 2.06 (baseline thought to be 2.20).   HTN: pt has been stable this admission, no additional  medications at this time. Careful with diuresis and orthostatic hypotension. Instructed patient to take BP at home and keep a log.  Hypokalemia: K 3.7 at discharge with the discharge diuretic and K replacement in place.  Recommend checking BMP in 1 week.  Atrial fibrillation  with frequent PVCs: Well rate-controlled. No anticoagulation due to frequent falls.   Consultants: None  Patient seen and examined by Dr. Royann Shivers today and was stable for discharge. All follow up has been arranged.  _____________  Discharge Vitals Blood pressure (!) 113/59, pulse (!) 47, temperature 97.5 F (36.4 C), temperature source Oral, resp. rate 20, height 5\' 6"  (1.676 m), weight 195 lb 11.2 oz (88.8 kg), SpO2 92 %.  Filed Weights   12/23/16 0536 12/24/16 0452 12/25/16 0605  Weight: 195 lb 9.6 oz (88.7 kg) 195 lb 8 oz (88.7 kg) 195 lb 11.2 oz (88.8 kg)    Physical Exam   General: Well developed, well nourished, male appearing in no acute distress. Head: Normocephalic, atraumatic.      Neck: Supple without bruits, + JVD. Lungs:  Resp regular and unlabored, CTA, diminished in bases. Heart: Irregular rhythm, regular rate, no murmur; no rub. Abdomen: Soft, non-tender, non-distended with normoactive bowel sounds. No hepatomegaly. No rebound/guarding. No obvious abdominal masses. Extremities: No clubbing, cyanosis, 1+ to 2+ edema. Distal pedal pulses are 1+ bilaterally. Neuro: Alert and oriented X 3. Moves all extremities spontaneously. Psych: Normal affect.   Labs & Radiologic Studies    CBC No results for input(s): WBC, NEUTROABS, HGB, HCT, MCV, PLT in the last 72 hours. Basic Metabolic Panel  Recent Labs  12/24/16 0830 12/25/16 0410  NA 137 136  K 4.4 3.7  CL 89* 91*  CO2 35* 32  GLUCOSE 111* 108*  BUN 98* 104*  CREATININE 2.03* 2.06*  CALCIUM 9.1 8.9   Liver Function Tests No results for input(s): AST, ALT, ALKPHOS, BILITOT, PROT, ALBUMIN in the last 72 hours. No results for input(s): LIPASE, AMYLASE in the last 72 hours. Cardiac Enzymes No results for input(s): CKTOTAL, CKMB, CKMBINDEX, TROPONINI in the last 72 hours. BNP Invalid input(s): POCBNP D-Dimer No results for input(s): DDIMER in the last 72 hours. Hemoglobin A1C No results for input(s):  HGBA1C in the last 72 hours. Fasting Lipid Panel No results for input(s): CHOL, HDL, LDLCALC, TRIG, CHOLHDL, LDLDIRECT in the last 72 hours. Thyroid Function Tests No results for input(s): TSH, T4TOTAL, T3FREE, THYROIDAB in the last 72 hours.  Invalid input(s): FREET3 _____________  No results found.   Diagnostic Studies/Procedures    Echocardiogram 11/03/15 Study Conclusions - Left ventricle: The cavity size was normal. Wall thickness was increased in a pattern of severe LVH. Systolic function was mildly to moderately reduced. The estimated ejection fraction was in the range of40% to 45%. Diffuse hypokinesis. - Aortic valve: Leaflets are heavily calcified and have reduced opening. The degree of AS may be underestimated by the current measurements. Valve mobility was mildly restricted. Valve area (VTI): 1.85 cm^2. Valve area (Vmax): 1.84 cm^2. Valve area (Vmean): 1.65 cm^2. - Aortic root: The aortic root was mildly dilated. - Mitral valve: There was moderate regurgitation. - Left atrium: The atrium was severely dilated. - Right atrium: The atrium was moderately dilated. - Pulmonary arteries: Systolic pressure was moderately increased. PA peak pressure: 50 mm Hg (S).   Myoview 05/17/15  The left ventricular ejection fraction is mildly decreased (45-54%).  Nuclear stress EF: 46%.  There was no ST segment deviation noted during stress.  This is  a low risk study.  Low risk stress nuclear study with a small, mild, reversible inferior basal defect of borderline significance (cannot R/O very mild ischemia); otherwise normal perfusion; EF 46 but visually appears better; suggest echo to further assess; mild LVE.   Disposition   Pt is being discharged to a SNF today in good condition.  Follow-up Plans & Appointments     Contact information for follow-up providers    Corine Shelter, PA-C Follow up on 12/30/2016.   Specialties:  Cardiology, Radiology Why:  2:00  pm for hopsital follow up Contact information: [address] [place] [address] [phone]            Contact information for after-discharge care    Destination    [facility] .   Specialty:  Skilled Nursing Facility Contact information: [address] [place] [address] [phone]                 Discharge Instructions    Diet - low sodium heart healthy    Complete by:  As directed    Increase activity slowly    Complete by:  As directed       Discharge Medications   Current Discharge Medication List    START taking these medications   Details  nitroGLYCERIN (NITROSTAT) 0.4 MG SL tablet Place 1 tablet (0.4 mg total) under the tongue every 5 (five) minutes x 3 doses as needed for chest pain. Qty: 25 tablet, Refills: 2      CONTINUE these medications which have CHANGED   Details  KLOR-CON M10 10 MEQ tablet Take 4 tablets (40 mEq total) by mouth twice daily. Qty: 240 tablet, Refills: 11    metolazone (ZAROXOLYN) 5 MG tablet Take 1 tablet (5 mg total) by mouth on Mondays, Wednesdays and Saturdays. May take an extra tablet on Sunday if weight is 200 pounds or more. Qty: 30 tablet, Refills: 5      CONTINUE these medications which have NOT CHANGED   Details  Artificial Tear Ointment (REFRESH P.M. OP) Place 1 application into both eyes at bedtime as needed (dry eyes).    aspirin EC 81 MG tablet Take 1 tablet (81 mg total) by mouth daily. Qty: 90 tablet, Refills: 3    brimonidine (ALPHAGAN P) 0.1 % SOLN Place 1 drop into both eyes daily.    Cholecalciferol (VITAMIN D3) 5000 units TABS Take 5,000 Units by mouth daily.    furosemide (LASIX) 40 MG tablet TAKE 2 TABLETS (80 MG TOTAL) BY MOUTH 2 (TWO) TIMES DAILY. Qty: 90 tablet, Refills: 3    Probiotic Product (PROBIOTIC PO) Take 1 tablet by mouth daily as needed (for stomach irritation).     Tamsulosin HCl (FLOMAX) 0.4 MG CAPS Take 0.4 mg by  mouth daily.     Travoprost, BAK Free, (TRAVATAN) 0.004 % SOLN ophthalmic solution Place 1 drop into both eyes at bedtime.    vitamin C (ASCORBIC ACID) 500 MG tablet Take 500 mg by mouth daily.          Outstanding Labs/Studies   Needs BMP for potassium and creatinine next week.   Duration of Discharge Encounter   Greater than 30 minutes including physician time.  Signed, Roe Rutherford Eyal Greenhaw PA-C 12/25/2016, 3:21 PM

## 2016-12-24 LAB — BASIC METABOLIC PANEL
ANION GAP: 13 (ref 5–15)
BUN: 98 mg/dL — ABNORMAL HIGH (ref 6–20)
CO2: 35 mmol/L — ABNORMAL HIGH (ref 22–32)
Calcium: 9.1 mg/dL (ref 8.9–10.3)
Chloride: 89 mmol/L — ABNORMAL LOW (ref 101–111)
Creatinine, Ser: 2.03 mg/dL — ABNORMAL HIGH (ref 0.61–1.24)
GFR calc non Af Amer: 27 mL/min — ABNORMAL LOW (ref >60)
GFR, EST AFRICAN AMERICAN: 31 mL/min — AB (ref >60)
GLUCOSE: 111 mg/dL — AB (ref 65–99)
POTASSIUM: 4.4 mmol/L (ref 3.5–5.1)
Sodium: 137 mmol/L (ref 135–145)

## 2016-12-24 NOTE — Clinical Social Work Placement (Signed)
   CLINICAL SOCIAL WORK PLACEMENT  NOTE  Date:  12/24/2016  Patient Details  Name: Wesley [name] MRN: [id number] Date of Birth: 1920-10-05  Clinical Social Work is seeking post-discharge placement for this patient at the [place] level of care (*CSW will initial, date and re-position this form in  chart as items are completed):  Yes   Patient/family provided with [facility] Work Department's list of facilities offering this level of care within the geographic area requested by the patient (or if unable, by the patient's family).  Yes   Patient/family informed of their freedom to choose among providers that offer the needed level of care, that participate in Medicare, Medicaid or managed care program needed by the patient, have an available bed and are willing to accept the patient.  Yes   Patient/family informed of [facility]'s ownership interest in [facility] and [facility], as well as of the fact that they are under no obligation to receive care at these facilities.  PASRR submitted to EDS on 12/24/16     PASRR number received on 12/24/16     Existing PASRR number confirmed on       FL2 transmitted to all facilities in geographic area requested by pt/family on 12/24/16     FL2 transmitted to all facilities within larger geographic area on       Patient informed that his/her managed care company has contracts with or will negotiate with certain facilities, including the following:            Patient/family informed of bed offers received.  Patient chooses bed at       Physician recommends and patient chooses bed at      Patient to be transferred to   on  .  Patient to be transferred to facility by       Patient family notified on   of transfer.  Name of family member notified:        PHYSICIAN Please sign FL2, Please sign DNR     Additional Comment:    _______________________________________________ Candie Chroman,  LCSW 12/24/2016, 1:20 PM

## 2016-12-24 NOTE — Care Management Important Message (Signed)
Important Message  Patient Details  Name: Wesley [name] MRN: [id number] Date of Birth: 11-01-1920   Medicare Important Message Given:  Yes    Catrena Vari Montine Circle 12/24/2016, 11:17 AM

## 2016-12-24 NOTE — Clinical Social Work Note (Signed)
Clinical Social Work Assessment  Patient Details  Name: Wesley [name] MRN: [id number] Date of Birth: 1921/07/17  Date of referral:  12/24/16               Reason for consult:  Facility Placement, Discharge Planning                Permission sought to share information with:  Facility Sport and exercise psychologist, Family Supports Permission granted to share information::  Yes, Verbal Permission Granted  Name::     [name]  Agency::  SNF's  Relationship::  Daughter  Contact Information:  [phone]  Housing/Transportation Living arrangements for the past 2 months:  Single Family Home Source of Information:  Patient, Medical Team, Adult Children Patient Interpreter Needed:  None Criminal Activity/Legal Involvement Pertinent to Current Situation/Hospitalization:  No - Comment as needed Significant Relationships:  Adult Children, Other Family Members Lives with:  Self Do you feel safe going back to the place where you live?  Yes Need for family participation in patient care:  Yes (Comment)  Care giving concerns:  PT recommending SNF once medically stable for discharge.   Social Worker assessment / plan:  CSW met with patient. Daughter and granddaughter at bedside. CSW introduced role and explained that PT recommendations would be discussed. Patient prefers to go home but understands need for SNF placement and is agreeable. First preference is [facility] due to proximity to his home but his daughter is agreeable to the [place] area as well so he can be close to her. No further concerns. CSW encouraged patient and his family to contact CSW as needed. CSW will continue to follow patient and his family for support and facilitate discharge to SNF once medically stable.  Employment status:  Retired Nurse, adult PT Recommendations:  [place] / Referral to community resources:  [place]  Patient/Family's Response to care:  Patient and his family agreeable to SNF placement. Patient's family supportive and involved in patient's care. Patient and his family appreciated social work intervention.  Patient/Family's Understanding of and Emotional Response to Diagnosis, Current Treatment, and Prognosis:  Patient and his family appear to have a good understanding of the reason for admission and reason for PT recommendations. Everyone agrees that SNF is the safest discharge option at this time until the patient builds his strength up to return home. Patient and his family appear happy with hospital care.  Emotional Assessment Appearance:  Appears stated age Attitude/Demeanor/Rapport:  Other (Pleasant) Affect (typically observed):  Accepting, Appropriate, Calm, Pleasant Orientation:  Oriented to Self, Oriented to Place, Oriented to  Time, Oriented to Situation Alcohol / Substance use:  Never Used Psych involvement (Current and /or in the community):  No (Comment)  Discharge Needs  Concerns to be addressed:  Care Coordination Readmission within the last 30 days:  No Current discharge risk:  Dependent with Mobility, Lives alone Barriers to Discharge:  Continued Medical Work up   Candie Chroman, LCSW 12/24/2016, 1:09 PM

## 2016-12-24 NOTE — NC FL2 (Signed)
[place] LEVEL OF CARE SCREENING TOOL     IDENTIFICATION  Patient Name: Wesley [name] Birthdate: 1921-03-29 Sex: male Admission Date (Current Location): 12/19/2016  [facility] and [place] Number:  Herbalist and Address:  [facility], [place] [address] [place],  [address]      Provider Number: [id number]  Attending Physician Name and Address:  Sanda Klein, MD  Relative Name and Phone Number:       Current Level of Care: Hospital Recommended Level of Care: [place] Prior Approval Number:    Date Approved/Denied:   PASRR Number: [id number]  Discharge Plan: SNF    Current Diagnoses: Patient Active Problem List   Diagnosis Date Noted  . Acute on chronic diastolic CHF (congestive heart failure) ([place]) 12/19/2016  . Persistent atrial fibrillation ([place]) 11/21/2015  . Chronic renal insufficiency, stage III (moderate) 11/07/2015  . New onset atrial flutter ([place]) 11/07/2015  . Aortic stenosis, mild 11/07/2015  . Pulmonary hypertension- PA 50 mmHg 11/07/2015  . Chronic combined systolic and diastolic CHF (congestive heart failure) ([place]) 11/02/2015  . Chronic diastolic heart failure ([place]) 05/07/2015  . Essential hypertension 05/07/2015  . Hyperlipidemia 05/07/2015  . History of shingles 05/07/2015    Orientation RESPIRATION BLADDER Height & Weight     Self, Time, Situation, Place  Normal Incontinent Weight: 195 lb 8 oz (88.7 kg) (scale a) Height:  5\' 6"  (167.6 cm)  BEHAVIORAL SYMPTOMS/MOOD NEUROLOGICAL BOWEL NUTRITION STATUS   (None)  (None) Continent Diet (Heart healthy)  AMBULATORY STATUS COMMUNICATION OF NEEDS Skin   Extensive Assist Verbally Skin abrasions, Bruising, Other (Comment) (Skin tear)                       Personal Care Assistance Level of Assistance  Bathing, Feeding, Dressing Bathing Assistance: Independent Feeding assistance: Independent Dressing Assistance: Independent      Functional Limitations Info  Sight, Hearing, Speech Sight Info: Adequate Hearing Info: Adequate Speech Info: Adequate    SPECIAL CARE FACTORS FREQUENCY  PT (By licensed PT), Blood pressure     PT Frequency: 5 x week              Contractures Contractures Info: Not present    Additional Factors Info  Code Status, Allergies Code Status Info: DNR Allergies Info: Neosporin (Neomycin-bacitracin Zn-polymyx), Penicillins           Current Medications (12/24/2016):  This is the current hospital active medication list Current Facility-Administered Medications  Medication Dose Route Frequency Provider Last Rate Last Dose  . acetaminophen (TYLENOL) tablet 650 mg  650 mg Oral Q4H PRN Bhavinkumar Bhagat, PA   650 mg at 12/23/16 1413  . aspirin EC tablet 81 mg  81 mg Oral Daily Bhavinkumar Bhagat, PA   81 mg at 12/24/16 0951  . brimonidine (ALPHAGAN) 0.15 % ophthalmic solution 1 drop  1 drop Both Eyes Daily Bhavinkumar Bhagat, PA   1 drop at 12/24/16 0951  . furosemide (LASIX) tablet 80 mg  80 mg Oral BID Mihai Croitoru, MD   80 mg at 12/24/16 0951  . heparin injection 5,000 Units  5,000 Units Subcutaneous Q8H Bhavinkumar Bhagat, PA   5,000 Units at 12/24/16 0604  . latanoprost (XALATAN) 0.005 % ophthalmic solution 1 drop  1 drop Both Eyes QHS Bhavinkumar Bhagat, PA   1 drop at 12/23/16 2142  . metolazone (ZAROXOLYN) tablet 5 mg  5 mg Oral Daily Lauren D Bajbus, RPH  5 mg at 12/24/16 0951  . nitroGLYCERIN (NITROSTAT) SL tablet 0.4 mg  0.4 mg Sublingual Q5 Min x 3 PRN Bhavinkumar Bhagat, PA      . ondansetron (ZOFRAN) injection 4 mg  4 mg Intravenous Q6H PRN Bhavinkumar Bhagat, PA      . potassium chloride SA (K-DUR,KLOR-CON) CR tablet 40 mEq  40 mEq Oral BID Tami Lin Duke, PA   40 mEq at 12/24/16 0951  . tamsulosin (FLOMAX) capsule 0.4 mg  0.4 mg Oral Daily Bhavinkumar Bhagat, PA   0.4 mg at 12/24/16 0951  . vitamin C (ASCORBIC ACID) tablet 500 mg  500 mg Oral Daily Bhavinkumar  Bhagat, PA   500 mg at 12/24/16 5732     Discharge Medications: Please see discharge summary for a list of discharge medications.  Relevant Imaging Results:  Relevant Lab Results:   Additional Information SS#: [id number]  Candie Chroman, LCSW

## 2016-12-24 NOTE — Evaluation (Signed)
Physical Therapy Evaluation Patient Details Name: ROMARI GASPARRO MRN: [id number] DOB: [birth year] Today's Date: 12/24/2016   History of Present Illness  Pt is a 83 y/o male admitted for IV diuresis. PMH including but not limited to CHF, HTN, CKD and a-fib.   Clinical Impression  Pt presented supine in bed with HOB elevated, awake and willing to participate in therapy session. Prior to admission, pt reported that he was mod I with mobility, using a RW to ambulate. Pt also stated that he performed bathing and dressing independently. He stated that his daughter comes by to assist him with cooking and cleaning during the day. Pt currently requires max A for sit-to-stand and mod A for stand-pivot transfers. PT recommending pt d/c to SNF for ST rehab prior to returning home. Pt would continue to benefit from skilled physical therapy services at this time while admitted and after d/c to address the below listed limitations in order to improve overall safety and independence with functional mobility.     Follow Up Recommendations SNF;Supervision/Assistance - 24 hour    Equipment Recommendations  None recommended by PT    Recommendations for Other Services       Precautions / Restrictions Precautions Precautions: Fall Restrictions Weight Bearing Restrictions: No      Mobility  Bed Mobility Overal bed mobility: Needs Assistance Bed Mobility: Supine to Sit     Supine to sit: Mod assist;HOB elevated     General bed mobility comments: increased time, heavy use of bed rails, mod A at trunk to achieve upright sitting and use of bed pads to position pt's hips at EOB  Transfers Overall transfer level: Needs assistance Equipment used: Rolling walker (2 wheeled) Transfers: Sit to/from [facility] Sit to Stand: Max assist Stand pivot transfers: Mod assist       General transfer comment: increased time, VC'ing for bilateral hand placement, max A to rise from bed and for  stability as pt with heavy posterior lean upon standing. Mod A for pivotal movements to recliner chair. Pt also very unsafe and grabbing for front of RW. When therapist educated pt on safe hand placement, pt became frustrated and remained with one hand on front of RW  Ambulation/Gait                Stairs            Wheelchair Mobility    Modified Rankin (Stroke Patients Only)       Balance Overall balance assessment: Needs assistance Sitting-balance support: Feet supported;Bilateral upper extremity supported Sitting balance-Leahy Scale: Poor Sitting balance - Comments: pt progressing from mod A to min A to maintain sitting EOB Postural control: Right lateral lean;Posterior lean Standing balance support: Bilateral upper extremity supported Standing balance-Leahy Scale: Poor Standing balance comment: pt reliant on bilateral UEs on RW and initially max A to maintain upright standing secondary to posterior lean                             Pertinent Vitals/Pain Pain Assessment: No/denies pain    Home Living Family/patient expects to be discharged to:: Private residence Living Arrangements: Alone Available Help at Discharge: Family;Available PRN/intermittently Type of Home: House Home Access: Level entry       Home Equipment: Walker - 2 wheels;Walker - 4 wheels;Shower seat;Other (comment) (lift chair) Additional Comments: pt reported that his daughter lives near by and comes over frequently to assist with cooking and housework. Pt  reports that he sleeps in his recliner/lift chair.    Prior Function Level of Independence: Needs assistance   Gait / Transfers Assistance Needed: pt reported that he ambulates with use of RW  ADL's / Homemaking Assistance Needed: pt reported that he is independent with dressing and bathing. He states that his daughter assists with cooking and cleaning.        Hand Dominance        Extremity/Trunk Assessment   Upper  Extremity Assessment Upper Extremity Assessment: Generalized weakness    Lower Extremity Assessment Lower Extremity Assessment: Generalized weakness    Cervical / Trunk Assessment Cervical / Trunk Assessment: Kyphotic  Communication   Communication: HOH  Cognition Arousal/Alertness: Awake/alert Behavior During Therapy: WFL for tasks assessed/performed Overall Cognitive Status: Impaired/Different from baseline Area of Impairment: Safety/judgement                         Safety/Judgement: Decreased awareness of safety;Decreased awareness of deficits     General Comments: pt reports that he feels safe and ready to return home; however, he requires a great deal of physical assistance with transfers and did not ambulate this session.      General Comments      Exercises     Assessment/Plan    PT Assessment Patient needs continued PT services  PT Problem List Decreased strength;Decreased balance;Decreased activity tolerance;Decreased coordination;Decreased mobility;Decreased knowledge of use of DME;Decreased safety awareness;Decreased knowledge of precautions;Cardiopulmonary status limiting activity       PT Treatment Interventions DME instruction;Gait training;Functional mobility training;Therapeutic activities;Therapeutic exercise;Balance training;Neuromuscular re-education;Patient/family education    PT Goals (Current goals can be found in the Care Plan section)  Acute Rehab PT Goals Patient Stated Goal: return home PT Goal Formulation: With patient/family Time For Goal Achievement: 01/07/17 Potential to Achieve Goals: Fair    Frequency Min 3X/week   Barriers to discharge        Co-evaluation               End of Session Equipment Utilized During Treatment: Gait belt Activity Tolerance: Patient tolerated treatment well Patient left: in chair;with call bell/phone within reach;with chair alarm set;with family/visitor present Nurse Communication:  Mobility status PT Visit Diagnosis: Other abnormalities of gait and mobility (R26.89);Muscle weakness (generalized) (M62.81)    Time: 1031-5945 PT Time Calculation (min) (ACUTE ONLY): 17 min   Charges:   PT Evaluation $PT Eval Moderate Complexity: 1 Procedure     PT G Codes:        Sherie Don, PT, DPT [place] 12/24/2016, 10:45 AM

## 2016-12-24 NOTE — Progress Notes (Signed)
Progress Note  Patient Name: Wesley [name] Date of Encounter: 12/24/2016  Primary Cardiologist: Dr. Sallyanne Kuster  Subjective   Patient is feeling well; denies chest pain, SOB, and palpitations. Pt states he worked with PT yesterday but is not yet back to baseline. He is willing to have HHPT come into his home.  Inpatient Medications    Scheduled Meds: . aspirin EC  81 mg Oral Daily  . brimonidine  1 drop Both Eyes Daily  . furosemide  80 mg Oral BID  . heparin  5,000 Units Subcutaneous Q8H  . latanoprost  1 drop Both Eyes QHS  . metolazone  5 mg Oral Daily  . potassium chloride  40 mEq Oral BID  . tamsulosin  0.4 mg Oral Daily  . vitamin C  500 mg Oral Daily   Continuous Infusions:  PRN Meds: acetaminophen, nitroGLYCERIN, ondansetron (ZOFRAN) IV   Vital Signs    Vitals:   12/23/16 0805 12/23/16 1500 12/23/16 2047 12/24/16 0452  BP: (!) 114/59 128/79 116/70 (!) 146/63  Pulse: 64 76 73 (!) 53  Resp: 17 18 18 18   Temp: 98.2 F (36.8 C) 97.9 F (36.6 C) 98.2 F (36.8 C) 97.8 F (36.6 C)  TempSrc: Oral Oral Oral Oral  SpO2: 96% 98% 97% 94%  Weight:    195 lb 8 oz (88.7 kg)  Height:        Intake/Output Summary (Last 24 hours) at 12/24/16 0746 Last data filed at 12/24/16 0456  Gross per 24 hour  Intake              630 ml  Output             1875 ml  Net            -1245 ml   Filed Weights   12/22/16 0656 12/23/16 0536 12/24/16 0452  Weight: 195 lb 1.6 oz (88.5 kg) 195 lb 9.6 oz (88.7 kg) 195 lb 8 oz (88.7 kg)     Physical Exam   General: Well developed, well nourished, male appearing in no acute distress. Head: Normocephalic, atraumatic.  Neck: Supple without bruits, + JVD. Lungs:  Resp regular and unlabored, CTA, diminished in bases. Heart: Irregular rhythm, regular rate, no murmur; no rub. Abdomen: Soft, non-tender, non-distended with normoactive bowel sounds. No hepatomegaly. No rebound/guarding. No obvious abdominal masses. Extremities: No clubbing,  cyanosis, 2+ edema. Distal pedal pulses are 2+ bilaterally. Neuro: Alert and oriented X 3. Moves all extremities spontaneously. Psych: Normal affect.  Labs    Chemistry Recent Labs Lab 12/19/16 1808  12/21/16 0418 12/21/16 1121 12/22/16 0749 12/23/16 0527  NA 135  < > 135 135 137 137  K 3.6  < > 3.0* 3.1* 3.2* 3.1*  CL 88*  < > 87* 90* 89* 87*  CO2 33*  < > 33* 34* 33* 37*  GLUCOSE 109*  < > 98 117* 100* 100*  BUN 99*  < > 97* 92* 91* 92*  CREATININE 2.25*  < > 2.07* 2.13* 2.08* 2.11*  CALCIUM 9.2  < > 9.1 9.1 9.2 9.3  PROT 5.9*  --   --   --   --   --   ALBUMIN 3.6  --  3.3*  --   --   --   AST 51*  --   --   --   --   --   ALT 30  --   --   --   --   --  ALKPHOS 70  --   --   --   --   --   BILITOT 1.5*  --   --   --   --   --   GFRNONAA 23*  < > 26* 25* 26* 25*  GFRAA 27*  < > 30* 29* 30* 29*  ANIONGAP 14  < > 15 11 15 13   < > = values in this interval not displayed.   Hematology Recent Labs Lab 12/19/16 1808 12/20/16 0418  WBC 9.0 7.8  RBC 4.42 4.30  HGB 14.0 13.3  HCT 41.6 40.6  MCV 94.1 94.4  MCH 31.7 30.9  MCHC 33.7 32.8  RDW 14.4 14.4  PLT 151 149*    Cardiac EnzymesNo results for input(s): TROPONINI in the last 168 hours. No results for input(s): TROPIPOC in the last 168 hours.   BNP Recent Labs Lab 12/19/16 1808  BNP 735.8*     DDimer No results for input(s): DDIMER in the last 168 hours.   Radiology    No results found.   Telemetry    Rate controlled Afib - Personally Reviewed  ECG    No new tracings - Personally Reviewed   Cardiac Studies   Echocardiogram 11/03/15 Study Conclusions - Left ventricle: The cavity size was normal. Wall thickness was increased in a pattern of severe LVH. Systolic function was mildly to moderately reduced. The estimated ejection fraction was in the range of40% to 45%. Diffuse hypokinesis. - Aortic valve: Leaflets are heavily calcified and have reduced opening. The degree of AS may be  underestimated by the current measurements. Valve mobility was mildly restricted. Valve area (VTI): 1.85 cm^2. Valve area (Vmax): 1.84 cm^2. Valve area (Vmean): 1.65 cm^2. - Aortic root: The aortic root was mildly dilated. - Mitral valve: There was moderate regurgitation. - Left atrium: The atrium was severely dilated. - Right atrium: The atrium was moderately dilated. - Pulmonary arteries: Systolic pressure was moderately increased. PA peak pressure: 50 mm Hg (S).   Myoview 05/17/15  The left ventricular ejection fraction is mildly decreased (45-54%).  Nuclear stress EF: 46%.  There was no ST segment deviation noted during stress.  This is a low risk study.  Low risk stress nuclear study with a small, mild, reversible inferior basal defect of borderline significance (cannot R/O very mild ischemia); otherwise normal perfusion; EF 46 but visually appears better; suggest echo to further assess; mild LVE.   Patient Profile     83 y.o. 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   Assessment & Plan    ATRIAL FIB: Rate controlled as above. Not on anticoagulation secondary to falls. Continue current meds. Frequent ectopy noted.   ACUTE ON CHRONIC SYSTOLIC AND DIASTOLIC HF: continue diuresis, wt 195 lbs today(206 on admission); last dry weight was re-established at under 200 lbs at clinic on 09/08/16. He is overall net negative 6.8L with 1.8L urine output yesteray - Given his left ventricle wall thickness in a pattern of severe LVH and low voltage EKG, may consider amyloidosis workup as outpatient. - continue lasix 80 mg PO BID with zaroxolyn 5 mg - will likely need to increased lasix and zaroxolyn regimen at discharge  FREQUENT PVCs: Mg WNL; K pending  CKD (STAGE III): Creatininehas been stable; BMP pending.  Baseline appears 2.20.No change in therapy.    HTN: BP is well controlled on current meds. Continue.   HYPOKALEMIA: BMP today  pending  DECONDITIONED: PT continues to work with patient. Yesterday, he was a 2-person assist. He has a walker at home. He is agreeable to having HHPT come to the house. He states his mobility is not back to baseline. Refuses SNF   Signed, Courtney Paris 7:46 AM 12/24/2016 Pager: [phone]   I have seen and examined the patient along with Tami Lin Duke PA.  I have reviewed the chart, notes and new data.  I agree with PA's note.  Key new complaints: feels well, optimistic. Still needs 2-person-assist just to stand up. Key examination changes: 1-2+ leg edema Key new findings / data: stable renal function  PLAN: Will decrease dose of diuretic at DC (probably metolazone 3 days a week again). He will need a stint in SNF for rehab - he is severely deconditioned and unable to return straight home.  Sanda Klein, MD, [place] [phone] 12/24/2016, 9:51 AM

## 2016-12-25 DIAGNOSIS — I10 Essential (primary) hypertension: Secondary | ICD-10-CM | POA: Diagnosis not present

## 2016-12-25 DIAGNOSIS — R2689 Other abnormalities of gait and mobility: Secondary | ICD-10-CM | POA: Diagnosis not present

## 2016-12-25 DIAGNOSIS — I509 Heart failure, unspecified: Secondary | ICD-10-CM | POA: Diagnosis not present

## 2016-12-25 DIAGNOSIS — I481 Persistent atrial fibrillation: Secondary | ICD-10-CM | POA: Diagnosis not present

## 2016-12-25 DIAGNOSIS — E785 Hyperlipidemia, unspecified: Secondary | ICD-10-CM | POA: Diagnosis not present

## 2016-12-25 DIAGNOSIS — R278 Other lack of coordination: Secondary | ICD-10-CM | POA: Diagnosis not present

## 2016-12-25 DIAGNOSIS — I272 Pulmonary hypertension, unspecified: Secondary | ICD-10-CM | POA: Diagnosis not present

## 2016-12-25 DIAGNOSIS — I350 Nonrheumatic aortic (valve) stenosis: Secondary | ICD-10-CM | POA: Diagnosis not present

## 2016-12-25 DIAGNOSIS — J80 Acute respiratory distress syndrome: Secondary | ICD-10-CM | POA: Diagnosis not present

## 2016-12-25 DIAGNOSIS — I5033 Acute on chronic diastolic (congestive) heart failure: Secondary | ICD-10-CM | POA: Diagnosis not present

## 2016-12-25 DIAGNOSIS — N183 Chronic kidney disease, stage 3 (moderate): Secondary | ICD-10-CM | POA: Diagnosis not present

## 2016-12-25 LAB — BASIC METABOLIC PANEL
Anion gap: 13 (ref 5–15)
BUN: 104 mg/dL — ABNORMAL HIGH (ref 6–20)
CALCIUM: 8.9 mg/dL (ref 8.9–10.3)
CO2: 32 mmol/L (ref 22–32)
Chloride: 91 mmol/L — ABNORMAL LOW (ref 101–111)
Creatinine, Ser: 2.06 mg/dL — ABNORMAL HIGH (ref 0.61–1.24)
GFR calc Af Amer: 30 mL/min — ABNORMAL LOW (ref >60)
GFR calc non Af Amer: 26 mL/min — ABNORMAL LOW (ref >60)
GLUCOSE: 108 mg/dL — AB (ref 65–99)
Potassium: 3.7 mmol/L (ref 3.5–5.1)
SODIUM: 136 mmol/L (ref 135–145)

## 2016-12-25 MED ORDER — NITROGLYCERIN 0.4 MG SL SUBL: 0.4000 mg | SUBLINGUAL_TABLET | SUBLINGUAL | 2 refills | Status: AC | PRN

## 2016-12-25 MED ORDER — METOLAZONE 5 MG PO TABS
ORAL_TABLET | ORAL | 5 refills | Status: DC
Start: 2016-12-25 — End: 2017-01-19

## 2016-12-25 MED ORDER — KLOR-CON M10 10 MEQ PO TBCR: EXTENDED_RELEASE_TABLET | ORAL | 11 refills | Status: AC

## 2016-12-25 NOTE — Clinical Social Work Note (Signed)
CSW provided patient and his daughter with bed offers. They have chosen [facility]. Admissions coordinator notified.  Dayton Scrape, [place]

## 2016-12-25 NOTE — Progress Notes (Signed)
Progress Note  Patient Name: Wesley [name] Date of Encounter: 12/25/2016  Primary Cardiologist: Ly Bacchi  Subjective   Feels well, making some progress with ambulation but still needs 2-person assist to stand. Walked with walker to the door and back.  Inpatient Medications    Scheduled Meds: . aspirin EC  81 mg Oral Daily  . brimonidine  1 drop Both Eyes Daily  . furosemide  80 mg Oral BID  . heparin  5,000 Units Subcutaneous Q8H  . latanoprost  1 drop Both Eyes QHS  . metolazone  5 mg Oral Daily  . potassium chloride  40 mEq Oral BID  . tamsulosin  0.4 mg Oral Daily  . vitamin C  500 mg Oral Daily   Continuous Infusions:  PRN Meds: acetaminophen, nitroGLYCERIN, ondansetron (ZOFRAN) IV   Vital Signs    Vitals:   12/24/16 1200 12/24/16 2144 12/25/16 0605 12/25/16 1116  BP: 115/73 (!) 110/54 99/64 (!) 113/59  Pulse: 80 (!) 53 61 (!) 47  Resp: 18 20 20 20   Temp: 97.6 F (36.4 C) 97.8 F (36.6 C) 98 F (36.7 C) 97.5 F (36.4 C)  TempSrc: Oral Oral Oral Oral  SpO2: 96% 95% 96% 92%  Weight:   88.8 kg (195 lb 11.2 oz)   Height:        Intake/Output Summary (Last 24 hours) at 12/25/16 1439 Last data filed at 12/25/16 1000  Gross per 24 hour  Intake              480 ml  Output             2100 ml  Net            -1620 ml   Filed Weights   12/23/16 0536 12/24/16 0452 12/25/16 0605  Weight: 88.7 kg (195 lb 9.6 oz) 88.7 kg (195 lb 8 oz) 88.8 kg (195 lb 11.2 oz)     Physical Exam  Comfortable GEN: No acute distress.   Neck: only 2-3 cm JVD Cardiac: irregular, no murmurs, rubs, or gallops.  Respiratory: Clear to auscultation bilaterally. GI: Soft, nontender, non-distended  MS: 1+ ankle edema; No deformity. Neuro:  Nonfocal  Psych: Normal affect   Labs    Chemistry Recent Labs Lab 12/19/16 1808  12/21/16 0418  12/23/16 0527 12/24/16 0830 12/25/16 0410  NA 135  < > 135  < > 137 137 136  K 3.6  < > 3.0*  < > 3.1* 4.4 3.7  CL 88*  < > 87*  < > 87*  89* 91*  CO2 33*  < > 33*  < > 37* 35* 32  GLUCOSE 109*  < > 98  < > 100* 111* 108*  BUN 99*  < > 97*  < > 92* 98* 104*  CREATININE 2.25*  < > 2.07*  < > 2.11* 2.03* 2.06*  CALCIUM 9.2  < > 9.1  < > 9.3 9.1 8.9  PROT 5.9*  --   --   --   --   --   --   ALBUMIN 3.6  --  3.3*  --   --   --   --   AST 51*  --   --   --   --   --   --   ALT 30  --   --   --   --   --   --   ALKPHOS 70  --   --   --   --   --   --  BILITOT 1.5*  --   --   --   --   --   --   GFRNONAA 23*  < > 26*  < > 25* 27* 26*  GFRAA 27*  < > 30*  < > 29* 31* 30*  ANIONGAP 14  < > 15  < > 13 13 13   < > = values in this interval not displayed.   Hematology Recent Labs Lab 12/19/16 1808 12/20/16 0418  WBC 9.0 7.8  RBC 4.42 4.30  HGB 14.0 13.3  HCT 41.6 40.6  MCV 94.1 94.4  MCH 31.7 30.9  MCHC 33.7 32.8  RDW 14.4 14.4  PLT 151 149*    Cardiac EnzymesNo results for input(s): TROPONINI in the last 168 hours. No results for input(s): TROPIPOC in the last 168 hours.   BNP Recent Labs Lab 12/19/16 1808  BNP 735.8*     DDimer No results for input(s): DDIMER in the last 168 hours.   Radiology    No results found.  Cardiac Studies   Echocardiogram 11/03/15 Study Conclusions - Left ventricle: The cavity size was normal. Wall thickness was increased in a pattern of severe LVH. Systolic function was mildly to moderately reduced. The estimated ejection fraction was in the range of40% to 45%. Diffuse hypokinesis. - Aortic valve: Leaflets are heavily calcified and have reduced opening. The degree of AS may be underestimated by the current measurements. Valve mobility was mildly restricted. Valve area (VTI): 1.85 cm^2. Valve area (Vmax): 1.84 cm^2. Valve area (Vmean): 1.65 cm^2. - Aortic root: The aortic root was mildly dilated. - Mitral valve: There was moderate regurgitation. - Left atrium: The atrium was severely dilated. - Right atrium: The atrium was moderately dilated. - Pulmonary  arteries: Systolic pressure was moderately increased. PA peak pressure: 50 mm Hg (S).   Myoview 05/17/15  The left ventricular ejection fraction is mildly decreased (45-54%).  Nuclear stress EF: 46%.  There was no ST segment deviation noted during stress.  This is a low risk study.  Low risk stress nuclear study with a small, mild, reversible inferior basal defect of borderline significance (cannot R/O very mild ischemia); otherwise normal perfusion; EF 46 but visually appears better; suggest echo to further assess; mild LVE.   Patient Profile     83 y.o. male with improved acute on chronic combined systolic and diastolic HF, permanent AFib, admitted for IV diuretics after failure of PO meds.  Assessment & Plan    1. CHF:  Could have underlying TTR amyloidosis. NEed to keep balance between diuresis for CHf and risk of orthostatic hypotension (he has had numerous falls) and renal insufficiency. 195 lb seems to be optimal range "dry weight". Plan furosemide 80 mg BID and metolazone 5 mg 3 times a week, but will need frequent reevaluation and monitoring of K and creatinine. Strict daily weight monitoring, whether in SNF or at home. 2. AFib: rate controlled, not anticoagulated due to high risk of falls and serious injury. 3. CKD: stable, at baseline after diuresis. 4. HTN: fair control. 5. Severe deconditioning:  Plan a brief stay at [facility] before returning home.  Signed, Sanda Klein, MD  12/25/2016, 2:39 PM

## 2016-12-25 NOTE — Clinical Social Work Placement (Signed)
   CLINICAL SOCIAL WORK PLACEMENT  NOTE  Date:  12/25/2016  Patient Details  Name: Wesley [name] MRN: [id number] Date of Birth: 1920-12-31  Clinical Social Work is seeking post-discharge placement for this patient at the [place] level of care (*CSW will initial, date and re-position this form in  chart as items are completed):  Yes   Patient/family provided with [facility] Work Department's list of facilities offering this level of care within the geographic area requested by the patient (or if unable, by the patient's family).  Yes   Patient/family informed of their freedom to choose among providers that offer the needed level of care, that participate in Medicare, Medicaid or managed care program needed by the patient, have an available bed and are willing to accept the patient.  Yes   Patient/family informed of [facility]'s ownership interest in [facility] and [facility], as well as of the fact that they are under no obligation to receive care at these facilities.  PASRR submitted to EDS on 12/24/16     PASRR number received on 12/24/16     Existing PASRR number confirmed on       FL2 transmitted to all facilities in geographic area requested by pt/family on 12/24/16     FL2 transmitted to all facilities within larger geographic area on       Patient informed that his/her managed care company has contracts with or will negotiate with certain facilities, including the following:        Yes   Patient/family informed of bed offers received.  Patient chooses bed at [facility]     Physician recommends and patient chooses bed at      Patient to be transferred to [facility] on 12/25/16.  Patient to be transferred to facility by [facility]     Patient family notified on 12/25/16 of transfer.  Name of family member notified:  [name]     PHYSICIAN Please sign DNR     Additional Comment:     _______________________________________________ Candie Chroman, LCSW 12/25/2016, 5:01 PM

## 2016-12-25 NOTE — Progress Notes (Signed)
Report give to Nurse at  [facility].

## 2016-12-25 NOTE — Progress Notes (Signed)
Patient with no complaints or concerns during 7pm - 7am shift. Slept during the night.   Lou Irigoyen, RN 

## 2016-12-25 NOTE — Clinical Social Work Note (Signed)
CSW facilitated patient discharge including contacting patient family and facility to confirm patient discharge plans. Clinical information faxed to facility and family agreeable with plan. CSW arranged ambulance transport via [facility] to [facility]. RN to call report prior to discharge [phone] Room 205).  CSW will sign off for now as social work intervention is no longer needed. Please consult [place] again if new needs arise.  Dayton Scrape, [place]

## 2016-12-29 DIAGNOSIS — E785 Hyperlipidemia, unspecified: Secondary | ICD-10-CM | POA: Diagnosis not present

## 2016-12-29 DIAGNOSIS — I10 Essential (primary) hypertension: Secondary | ICD-10-CM | POA: Diagnosis not present

## 2016-12-29 DIAGNOSIS — I509 Heart failure, unspecified: Secondary | ICD-10-CM | POA: Diagnosis not present

## 2016-12-29 DIAGNOSIS — I350 Nonrheumatic aortic (valve) stenosis: Secondary | ICD-10-CM | POA: Diagnosis not present

## 2016-12-29 DIAGNOSIS — N183 Chronic kidney disease, stage 3 (moderate): Secondary | ICD-10-CM | POA: Diagnosis not present

## 2016-12-30 ENCOUNTER — Ambulatory Visit: Payer: Medicare Other | Admitting: Cardiology

## 2017-01-01 DIAGNOSIS — I350 Nonrheumatic aortic (valve) stenosis: Secondary | ICD-10-CM | POA: Diagnosis not present

## 2017-01-01 DIAGNOSIS — Z7982 Long term (current) use of aspirin: Secondary | ICD-10-CM | POA: Diagnosis not present

## 2017-01-01 DIAGNOSIS — Z9181 History of falling: Secondary | ICD-10-CM | POA: Diagnosis not present

## 2017-01-01 DIAGNOSIS — I481 Persistent atrial fibrillation: Secondary | ICD-10-CM | POA: Diagnosis not present

## 2017-01-01 DIAGNOSIS — I5042 Chronic combined systolic (congestive) and diastolic (congestive) heart failure: Secondary | ICD-10-CM | POA: Diagnosis not present

## 2017-01-01 DIAGNOSIS — E785 Hyperlipidemia, unspecified: Secondary | ICD-10-CM | POA: Diagnosis not present

## 2017-01-01 DIAGNOSIS — N183 Chronic kidney disease, stage 3 (moderate): Secondary | ICD-10-CM | POA: Diagnosis not present

## 2017-01-01 DIAGNOSIS — I272 Pulmonary hypertension, unspecified: Secondary | ICD-10-CM | POA: Diagnosis not present

## 2017-01-01 DIAGNOSIS — I130 Hypertensive heart and chronic kidney disease with heart failure and stage 1 through stage 4 chronic kidney disease, or unspecified chronic kidney disease: Secondary | ICD-10-CM | POA: Diagnosis not present

## 2017-01-03 DIAGNOSIS — I272 Pulmonary hypertension, unspecified: Secondary | ICD-10-CM | POA: Diagnosis not present

## 2017-01-03 DIAGNOSIS — E785 Hyperlipidemia, unspecified: Secondary | ICD-10-CM | POA: Diagnosis not present

## 2017-01-03 DIAGNOSIS — I130 Hypertensive heart and chronic kidney disease with heart failure and stage 1 through stage 4 chronic kidney disease, or unspecified chronic kidney disease: Secondary | ICD-10-CM | POA: Diagnosis not present

## 2017-01-03 DIAGNOSIS — N183 Chronic kidney disease, stage 3 (moderate): Secondary | ICD-10-CM | POA: Diagnosis not present

## 2017-01-03 DIAGNOSIS — I5042 Chronic combined systolic (congestive) and diastolic (congestive) heart failure: Secondary | ICD-10-CM | POA: Diagnosis not present

## 2017-01-03 DIAGNOSIS — I350 Nonrheumatic aortic (valve) stenosis: Secondary | ICD-10-CM | POA: Diagnosis not present

## 2017-01-03 DIAGNOSIS — Z7982 Long term (current) use of aspirin: Secondary | ICD-10-CM | POA: Diagnosis not present

## 2017-01-03 DIAGNOSIS — Z9181 History of falling: Secondary | ICD-10-CM | POA: Diagnosis not present

## 2017-01-03 DIAGNOSIS — I481 Persistent atrial fibrillation: Secondary | ICD-10-CM | POA: Diagnosis not present

## 2017-01-09 DIAGNOSIS — Z7982 Long term (current) use of aspirin: Secondary | ICD-10-CM | POA: Diagnosis not present

## 2017-01-09 DIAGNOSIS — I350 Nonrheumatic aortic (valve) stenosis: Secondary | ICD-10-CM | POA: Diagnosis not present

## 2017-01-09 DIAGNOSIS — E785 Hyperlipidemia, unspecified: Secondary | ICD-10-CM | POA: Diagnosis not present

## 2017-01-09 DIAGNOSIS — Z9181 History of falling: Secondary | ICD-10-CM | POA: Diagnosis not present

## 2017-01-09 DIAGNOSIS — N183 Chronic kidney disease, stage 3 (moderate): Secondary | ICD-10-CM | POA: Diagnosis not present

## 2017-01-09 DIAGNOSIS — I130 Hypertensive heart and chronic kidney disease with heart failure and stage 1 through stage 4 chronic kidney disease, or unspecified chronic kidney disease: Secondary | ICD-10-CM | POA: Diagnosis not present

## 2017-01-09 DIAGNOSIS — I5042 Chronic combined systolic (congestive) and diastolic (congestive) heart failure: Secondary | ICD-10-CM | POA: Diagnosis not present

## 2017-01-09 DIAGNOSIS — I481 Persistent atrial fibrillation: Secondary | ICD-10-CM | POA: Diagnosis not present

## 2017-01-09 DIAGNOSIS — I272 Pulmonary hypertension, unspecified: Secondary | ICD-10-CM | POA: Diagnosis not present

## 2017-01-10 DIAGNOSIS — I272 Pulmonary hypertension, unspecified: Secondary | ICD-10-CM | POA: Diagnosis not present

## 2017-01-10 DIAGNOSIS — I130 Hypertensive heart and chronic kidney disease with heart failure and stage 1 through stage 4 chronic kidney disease, or unspecified chronic kidney disease: Secondary | ICD-10-CM | POA: Diagnosis not present

## 2017-01-10 DIAGNOSIS — Z7982 Long term (current) use of aspirin: Secondary | ICD-10-CM | POA: Diagnosis not present

## 2017-01-10 DIAGNOSIS — I481 Persistent atrial fibrillation: Secondary | ICD-10-CM | POA: Diagnosis not present

## 2017-01-10 DIAGNOSIS — Z9181 History of falling: Secondary | ICD-10-CM | POA: Diagnosis not present

## 2017-01-10 DIAGNOSIS — N183 Chronic kidney disease, stage 3 (moderate): Secondary | ICD-10-CM | POA: Diagnosis not present

## 2017-01-10 DIAGNOSIS — I5042 Chronic combined systolic (congestive) and diastolic (congestive) heart failure: Secondary | ICD-10-CM | POA: Diagnosis not present

## 2017-01-10 DIAGNOSIS — E785 Hyperlipidemia, unspecified: Secondary | ICD-10-CM | POA: Diagnosis not present

## 2017-01-10 DIAGNOSIS — I350 Nonrheumatic aortic (valve) stenosis: Secondary | ICD-10-CM | POA: Diagnosis not present

## 2017-01-13 DIAGNOSIS — N183 Chronic kidney disease, stage 3 (moderate): Secondary | ICD-10-CM | POA: Diagnosis not present

## 2017-01-13 DIAGNOSIS — I130 Hypertensive heart and chronic kidney disease with heart failure and stage 1 through stage 4 chronic kidney disease, or unspecified chronic kidney disease: Secondary | ICD-10-CM | POA: Diagnosis not present

## 2017-01-13 DIAGNOSIS — I481 Persistent atrial fibrillation: Secondary | ICD-10-CM | POA: Diagnosis not present

## 2017-01-13 DIAGNOSIS — I272 Pulmonary hypertension, unspecified: Secondary | ICD-10-CM | POA: Diagnosis not present

## 2017-01-13 DIAGNOSIS — E785 Hyperlipidemia, unspecified: Secondary | ICD-10-CM | POA: Diagnosis not present

## 2017-01-13 DIAGNOSIS — I350 Nonrheumatic aortic (valve) stenosis: Secondary | ICD-10-CM | POA: Diagnosis not present

## 2017-01-13 DIAGNOSIS — Z9181 History of falling: Secondary | ICD-10-CM | POA: Diagnosis not present

## 2017-01-13 DIAGNOSIS — I5042 Chronic combined systolic (congestive) and diastolic (congestive) heart failure: Secondary | ICD-10-CM | POA: Diagnosis not present

## 2017-01-13 DIAGNOSIS — Z7982 Long term (current) use of aspirin: Secondary | ICD-10-CM | POA: Diagnosis not present

## 2017-01-15 DIAGNOSIS — I130 Hypertensive heart and chronic kidney disease with heart failure and stage 1 through stage 4 chronic kidney disease, or unspecified chronic kidney disease: Secondary | ICD-10-CM | POA: Diagnosis not present

## 2017-01-15 DIAGNOSIS — I350 Nonrheumatic aortic (valve) stenosis: Secondary | ICD-10-CM | POA: Diagnosis not present

## 2017-01-15 DIAGNOSIS — I481 Persistent atrial fibrillation: Secondary | ICD-10-CM | POA: Diagnosis not present

## 2017-01-15 DIAGNOSIS — E785 Hyperlipidemia, unspecified: Secondary | ICD-10-CM | POA: Diagnosis not present

## 2017-01-15 DIAGNOSIS — Z7982 Long term (current) use of aspirin: Secondary | ICD-10-CM | POA: Diagnosis not present

## 2017-01-15 DIAGNOSIS — I272 Pulmonary hypertension, unspecified: Secondary | ICD-10-CM | POA: Diagnosis not present

## 2017-01-15 DIAGNOSIS — N183 Chronic kidney disease, stage 3 (moderate): Secondary | ICD-10-CM | POA: Diagnosis not present

## 2017-01-15 DIAGNOSIS — Z9181 History of falling: Secondary | ICD-10-CM | POA: Diagnosis not present

## 2017-01-15 DIAGNOSIS — I5042 Chronic combined systolic (congestive) and diastolic (congestive) heart failure: Secondary | ICD-10-CM | POA: Diagnosis not present

## 2017-01-16 DIAGNOSIS — I5042 Chronic combined systolic (congestive) and diastolic (congestive) heart failure: Secondary | ICD-10-CM | POA: Diagnosis not present

## 2017-01-16 DIAGNOSIS — N183 Chronic kidney disease, stage 3 (moderate): Secondary | ICD-10-CM | POA: Diagnosis not present

## 2017-01-16 DIAGNOSIS — Z7982 Long term (current) use of aspirin: Secondary | ICD-10-CM | POA: Diagnosis not present

## 2017-01-16 DIAGNOSIS — I481 Persistent atrial fibrillation: Secondary | ICD-10-CM | POA: Diagnosis not present

## 2017-01-16 DIAGNOSIS — E785 Hyperlipidemia, unspecified: Secondary | ICD-10-CM | POA: Diagnosis not present

## 2017-01-16 DIAGNOSIS — I130 Hypertensive heart and chronic kidney disease with heart failure and stage 1 through stage 4 chronic kidney disease, or unspecified chronic kidney disease: Secondary | ICD-10-CM | POA: Diagnosis not present

## 2017-01-16 DIAGNOSIS — Z9181 History of falling: Secondary | ICD-10-CM | POA: Diagnosis not present

## 2017-01-16 DIAGNOSIS — I350 Nonrheumatic aortic (valve) stenosis: Secondary | ICD-10-CM | POA: Diagnosis not present

## 2017-01-16 DIAGNOSIS — I272 Pulmonary hypertension, unspecified: Secondary | ICD-10-CM | POA: Diagnosis not present

## 2017-01-19 ENCOUNTER — Encounter: Payer: Self-pay | Admitting: Cardiovascular Disease

## 2017-01-19 ENCOUNTER — Ambulatory Visit (INDEPENDENT_AMBULATORY_CARE_PROVIDER_SITE_OTHER): Payer: Medicare Other | Admitting: Cardiovascular Disease

## 2017-01-19 ENCOUNTER — Telehealth: Payer: Self-pay | Admitting: Cardiovascular Disease

## 2017-01-19 VITALS — BP 90/62 | HR 81 | Ht 66.0 in | Wt 205.0 lb

## 2017-01-19 DIAGNOSIS — I10 Essential (primary) hypertension: Secondary | ICD-10-CM | POA: Diagnosis not present

## 2017-01-19 DIAGNOSIS — N183 Chronic kidney disease, stage 3 unspecified: Secondary | ICD-10-CM

## 2017-01-19 DIAGNOSIS — I350 Nonrheumatic aortic (valve) stenosis: Secondary | ICD-10-CM | POA: Diagnosis not present

## 2017-01-19 DIAGNOSIS — Z515 Encounter for palliative care: Secondary | ICD-10-CM

## 2017-01-19 DIAGNOSIS — I4819 Other persistent atrial fibrillation: Secondary | ICD-10-CM

## 2017-01-19 DIAGNOSIS — I481 Persistent atrial fibrillation: Secondary | ICD-10-CM | POA: Diagnosis not present

## 2017-01-19 DIAGNOSIS — I5042 Chronic combined systolic (congestive) and diastolic (congestive) heart failure: Secondary | ICD-10-CM

## 2017-01-19 MED ORDER — METOLAZONE 5 MG PO TABS: 5.0000 mg | ORAL_TABLET | ORAL | 3 refills | Status: AC

## 2017-01-19 NOTE — Progress Notes (Signed)
Patient ID: Wesley [name], male   DOB: 06-Feb-1921, 83 y.o.   MRN: [id number] Patient ID: Wesley [name], male   DOB: 1921/08/22, 83 y.o.   MRN: [id number]    Cardiology Office Note    Date:  01/19/2017   ID:  Wesley [name], DOB 18-Sep-1921, MRN [id number]  PCP:  Horatio Pel, MD  Cardiologist:   Wesley Klein, MD   Chief Complaint  Patient presents with  . Follow-up    pt c/o doe    History of Present Illness:  Wesley [name] is a 83 y.o. 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 Despite atrial fibrillation, anticoagulants have been stopped due to very frequent and dangerous falls.  He was hospitalized last month for edema and dyspnea unresponsive to oral diuretic escalation. He was discharged after treatment with intravenous diuretics with a "dry weight" of 195 pounds on the hospital scale (which seems to match our office scale). His home scale showed about 187 pounds at the time. He was taking furosemide 80 mg twice daily and metolazone 3 times weekly. He was seen in the nephrology clinic about a week ago and his BUN had increased to 96 and creatinine 2.38. He was told to stop taking metolazone. In one week he has gained approximately 10 pounds. Labs performed a few days ago show improvement with a BUN of 82 and creatinine of 1.97 (his baseline creatinine is around 2). Additional labs of significance were hemoglobin of 14.1, potassium 4.7 and elevated magnesium of 2.7.  He is sleeping sitting straight up. He has developed worsening leg edema and has constant weeping from his left anterior shin area. He is lethargic throughout most of the day, but is easy to awake. He did very poorly while he was in a skilled nursing facility. According to his daughter he was very mean to all the staff and was constant in a bad  mood.  He continues to staunchly refuse to leave his home. He has had a couple more falls, thankfully without head injury. He has a bruise on his right shoulder where he fell against the wall.  He has not had any serious falls in the last couple weeks, but just last Wednesday he slid off his bed. He is suffering from an upper respiratory infection/bronchitis. His family has noticed marked worsening of his edema and despite increasing his diuretic dose has not noticed any improvement. His breathing seems a little more labored but he does not have orthopnea.  He has severe left ventricular hypertrophy by echo but no significant hypervoltage on his electrocardiogram, raising the possibility of cardiac amyloidosis. His nuclear stress test did not show coronary insufficiency. His echocardiogram performed in February 2017 showed a left ventricular ejection fraction of 40-45%, comparable to the ejection fraction estimated by nuclear scintigraphy in October 2016 at 46%. The echo again confirmed that his aortic stenosis is very mild. The left atrium is severely dilated. The Doppler parameters suggest that he was volume overloaded (E/e'=14). The systolic PA pressure was estimated at approximately 50 mmHg.    Past Medical History:  Diagnosis Date  . Arthritis    oa  . Chronic diastolic heart failure ([place]) 05/07/2015  . Essential hypertension 05/07/2015  . Family history of adverse reaction to anesthesia    daughter has difficulty waking   . Hyperlipidemia 05/07/2015  . Hypertension  Past Surgical History:  Procedure Laterality Date  . lower back    . TOTAL KNEE ARTHROPLASTY Bilateral     Outpatient Medications Prior to Visit  Medication Sig Dispense Refill  . Artificial Tear Ointment (REFRESH P.M. OP) Place 1 application into both eyes at bedtime as needed (dry eyes).    Marland Kitchen aspirin EC 81 MG tablet Take 1 tablet (81 mg total) by mouth daily. 90 tablet 3  . brimonidine (ALPHAGAN P) 0.1 % SOLN Place 1  drop into both eyes daily.    . Cholecalciferol (VITAMIN D3) 5000 units TABS Take 5,000 Units by mouth daily.    . furosemide (LASIX) 40 MG tablet TAKE 2 TABLETS (80 MG TOTAL) BY MOUTH 2 (TWO) TIMES DAILY. 90 tablet 3  . KLOR-CON M10 10 MEQ tablet Take 4 tablets (40 mEq total) by mouth twice daily. 240 tablet 11  . nitroGLYCERIN (NITROSTAT) 0.4 MG SL tablet Place 1 tablet (0.4 mg total) under the tongue every 5 (five) minutes x 3 doses as needed for chest pain. 25 tablet 2  . Probiotic Product (PROBIOTIC PO) Take 1 tablet by mouth daily as needed (for stomach irritation).     . Tamsulosin HCl (FLOMAX) 0.4 MG CAPS Take 0.4 mg by mouth daily.     . Travoprost, BAK Free, (TRAVATAN) 0.004 % SOLN ophthalmic solution Place 1 drop into both eyes at bedtime.    . vitamin C (ASCORBIC ACID) 500 MG tablet Take 500 mg by mouth daily.    . metolazone (ZAROXOLYN) 5 MG tablet Take 1 tablet (5 mg total) by mouth on Mondays, Wednesdays and Saturdays. May take an extra tablet on Sunday if weight is 200 pounds or more. (Patient not taking: Reported on 01/19/2017) 30 tablet 5   No facility-administered medications prior to visit.      Allergies:   Neosporin [neomycin-bacitracin zn-polymyx] and Penicillins   Social History   Social History  . Marital status: Widowed    Spouse name: N/A  . Number of children: N/A  . Years of education: N/A   Social History Main Topics  . Smoking status: Former Research scientist (life sciences)  . Smokeless tobacco: Never Used  . Alcohol use No  . Drug use: No  . Sexual activity: Not Asked   Other Topics Concern  . None   Social History Narrative  . None     Family History:  The patient's family history includes Diabetes in his son; Heart Problems in his mother; Hypertension in his daughter; Kidney disease in his sister; Stroke in his father.   ROS:   Please see the history of present illness.    ROS All other systems reviewed and are negative.   PHYSICAL EXAM:   VS:  BP 90/62   Pulse  81   Ht 5\' 6"  (1.676 m)   Wt 93 kg (205 lb)   BMI 33.09 kg/m    GEN: Well nourished, well developed, in no acute distress. A little sleepy, but very easy to awake and fully alert and oriented during our conversation  HEENT: normal  Neck: 8-10 cm JVD with prompt hepatojugular reflux all the way to the earlobes, no carotid bruit or masses Cardiac: irregular; 2/6 early peaking right upper sternal border systolic ejection murmur, no diastolic murmurs, rubs, or gallops, 3+ symmetrical pitting calf and ankle edema. Steady weeping from small skin tear right shin  Respiratory:  Managed in both bases, but otherwise clear to auscultation bilaterally, normal work of breathing GI: soft, nontender, nondistended, + BS  MS: no deformity or atrophy  Skin: warm and dry, no rash Neuro:  Alert and Oriented x 3, Strength and sensation are intact Psych: euthymic mood, full affect  Wt Readings from Last 3 Encounters:  01/19/17 93 kg (205 lb)  12/25/16 88.8 kg (195 lb 11.2 oz)  12/16/16 94.9 kg (209 lb 3.2 oz)      Studies/Labs Reviewed:   EKG:  EKG is ordered today.  It shows atrial fibrillation with controlled ventricular rate, occasional aberrancy, right bundle branch block left anterior fascicular block, QTC 448 ms  Recent Labs: 01/30/2016: TSH 3.04 12/19/2016: ALT 30; B Natriuretic Peptide 735.8 12/20/2016: Hemoglobin 13.3; Platelets 149 12/22/2016: Magnesium 2.3 12/25/2016: BUN 104; Creatinine, Ser 2.06; Potassium 3.7; Sodium 136   Last month sodium 138, potassium 3.9, BUN 96, creatinine 2.38, hemoglobin 14.7 with normal iron studies, WBC 8.6k  ASSESSMENT:    1. Chronic combined systolic and diastolic CHF (congestive heart failure) ([place])   2. Palliative care status   3. Persistent atrial fibrillation ([place])   4. Aortic stenosis, mild   5. Essential hypertension   6. Chronic renal insufficiency, stage III (moderate)      PLAN:  In order of problems listed above:  1. 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 He is again markedly hypervolemic, dyspnea, leg edema and weeping wounds in his right shin. He is having trouble with sodium restriction despite his family's efforts. He is putting [employer] on everything. His renal function parameters are little better and I will restart his metolazone.  2. Palliative care: Talked to Mr. Strole and his family in detail today about goals of care. I think he will continue to deteriorate. I think the focus should be on providing comfort care. Recommended home hospice. He has a living will. Reaffirmed his DO NOT RESUSCITATE status today. I would stop checking labs and simply adjust his diuretics to provide optimal relief from edema and dyspnea. Both the patient and his daughter are familiar with home hospice and were very appreciative of their assistance when Mrs. Sturgill passed away about 7 years ago. 3. AFib: Likely a permanent arrhythmia, spontaneously controlled ventricular rate. Anticoagulation stopped due to frequent falls and injuries. Thankfully he has not had any neurological events or other embolic complications 4. AS: this does not appear to be hemodynamically significant or contributing to heart failure 5. HTN: Blood pressure is low today, he is not taking any antihypertensives other than the diuretics and the hypotensive effects of tamsulosin. He really needs the latter medication, it typically takes and 15-20 minutes to empty his bladder. 6. CKD stage 3-4: Avoiding RAAS inhibitors and spironolactone due to hyperkalemia and volatile renal function. I suspect his kidney function will deteriorate further with additional diuresis, but the focus should be on providing relief of symptoms.. Seems to be at baseline creatinine of around 2.0-2.2.   Medication Adjustments/Labs  and Tests Ordered: Current medicines are reviewed at length with the patient today.  Concerns regarding medicines are outlined above.  Medication changes, Labs and Tests ordered today are listed in the Patient Instructions below. Patient Instructions  Dr Sallyanne Kuster has recommended making the following medication changes: 1. START Metolazone 5 mg - take 1 tablet by mouth every other day  Your physician recommends that you schedule a follow-up appointment in 1 month.  Signed, Wesley Klein, MD  01/19/2017 2:06 PM  [facility] [place], [place], [place]  [address] Phone: [phone]; Fax: [phone]   Patient ID: Wesley [name], male   DOB: 05-05-1921, 83 y.o.   MRN: [id number]

## 2017-01-19 NOTE — Telephone Encounter (Signed)
Patient Daughter Wesley [name]) calling to discuss the recommendations from the office visit today. Please call, thanks.

## 2017-01-19 NOTE — Telephone Encounter (Signed)
Patient's daughter stated that she would like to speak with the physician or his nurse about the decisions made today.

## 2017-01-19 NOTE — Patient Instructions (Signed)
Dr Sallyanne Kuster has recommended making the following medication changes: 1. START Metolazone 5 mg - take 1 tablet by mouth every other day  Your physician recommends that you schedule a follow-up appointment in 1 month.

## 2017-01-22 NOTE — Telephone Encounter (Signed)
Returned call to daughter-very concerned and wants someone from [facility] to come out to see patient ASAP.  Reports patient has declined rapidly-fell in the bathroom (daughter thinks he passed out), swelling has increased 3x but patient is urinating nonstop.  Daughter also concerned that patient has MRSA on his buttock, reports red, "horrible boil" on his buttock-placed silver and gauze on last night.  Reports patient is very uncomfortable and 'cant stand to sit and this man all he does is sit".  Reports someone is having to stay with patient 24 hours now, states "I dont think he is going to last 2 weeks".  States she has reached out to [facility] but has not received a call back.  Really wants someone to come out to see him today. Advised I would make Dr. Sallyanne Kuster aware and reach out to hospice to follow up.  Hospice referral was placed at [facility] on Monday 4/23.  Reached out to [facility]-did not receive referral.  Information given that is needed for referral-will contact daughter and see patient within 24 hours.     Per Dr. Burnice Logan be attending.    Katharine Look form Hospice will fax over standing orders to sign.    Daughter made aware Hospice will be reaching out to her and should see patient within 24 hours per Katharine Look.   Daughter aware and verbalized understanding.

## 2017-01-22 NOTE — Telephone Encounter (Signed)
New message     Pt was suppose to be turned over to Hospice they will not come without Dr Sallyanne Kuster order  Also think pt has MRSA on his rear end  Swelling 3x worse, cant get out of bed

## 2017-01-29 ENCOUNTER — Telehealth: Payer: Self-pay | Admitting: Cardiovascular Disease

## 2017-01-29 NOTE — Telephone Encounter (Signed)
Received Death Certificate from [facility] for Dr Sallyanne Kuster to review and sign.  [facility] that Dr Sallyanne Kuster would be available on Monday to review.

## 2017-02-03 ENCOUNTER — Telehealth: Payer: Self-pay | Admitting: Cardiovascular Disease

## 2017-02-03 NOTE — Telephone Encounter (Signed)
02/02/17 received signed Death Certificate back from Dr Sallyanne Kuster.  Notified [facility] Certificate ready to be picked up. lp

## 2017-02-17 ENCOUNTER — Ambulatory Visit: Payer: Medicare Other | Admitting: Cardiovascular Disease

## 2017-02-27 DEATH — deceased

## 2017-08-01 IMAGING — MR MR HIP*R* W/O CM
4 of 5 series · 16 of 40 positions shown · non-contrast
Comparison: None.

CLINICAL DATA: Status post fall while attempting to sit down 6 days
ago. Lateral right hip pain with walking. Initial encounter.

EXAM:
MR OF THE RIGHT HIP WITHOUT CONTRAST
TECHNIQUE: Multiplanar, multisequence MR imaging was performed. No intravenous
contrast was administered.

[Series 4: T1 · coronal · 4.0mm · 0.59mm/px · 3 of 19 slices shown (1 of 2)]
[im 4/19]
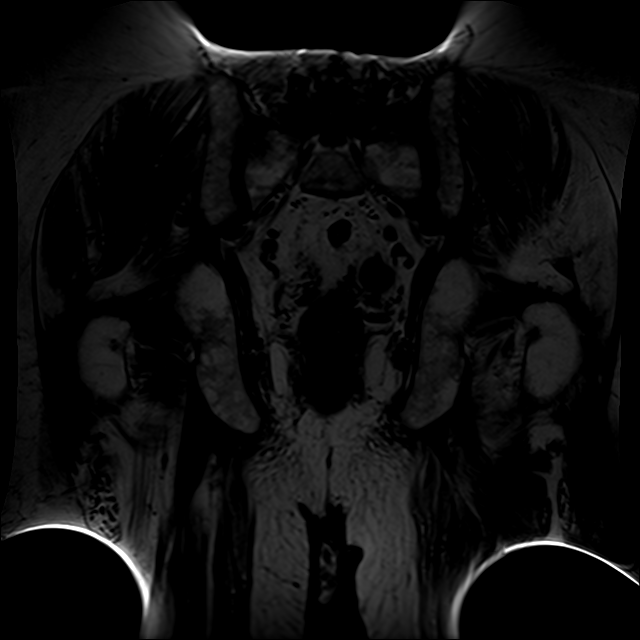
[im 10/19]
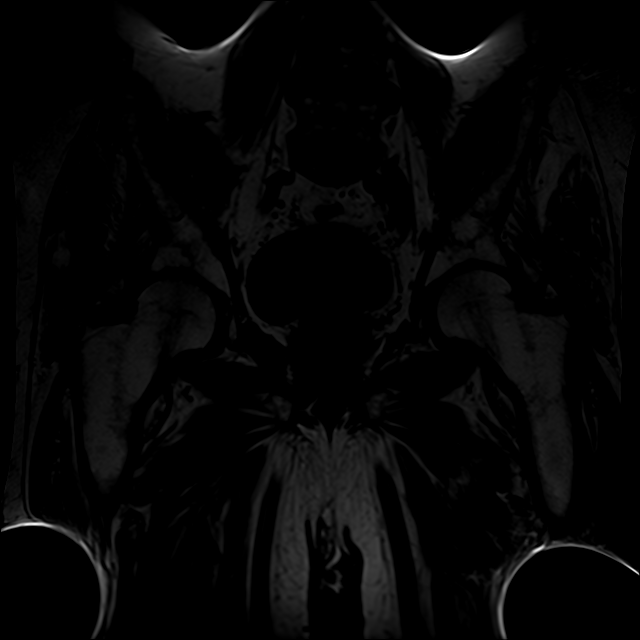
[im 16/19]
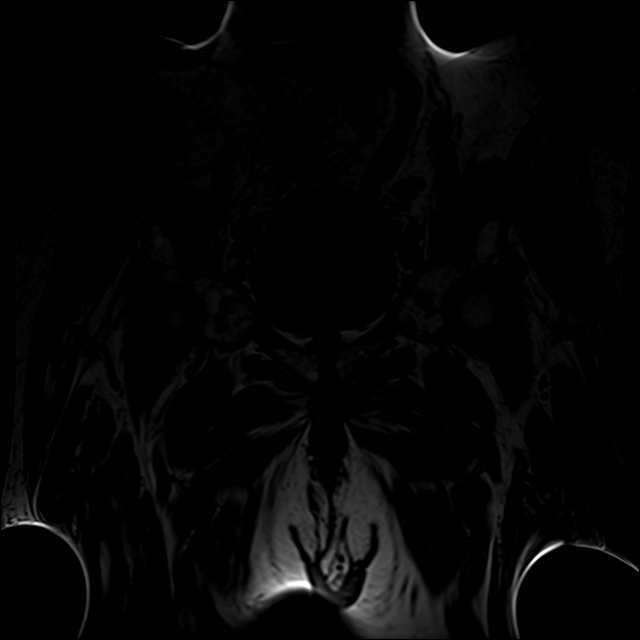

[Series 5: T2 fat-sat · axial · 4.0mm · 0.59mm/px · z∈[+8,+92]mm · 3 of 23 slices shown]
[im 3/23]
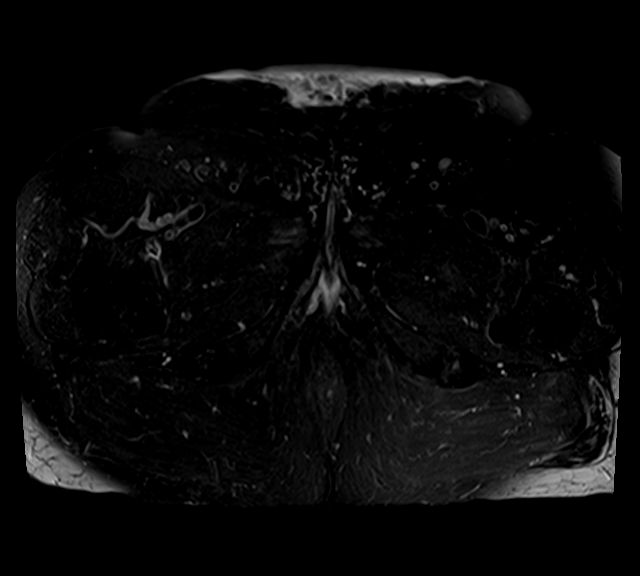
[im 12/23]
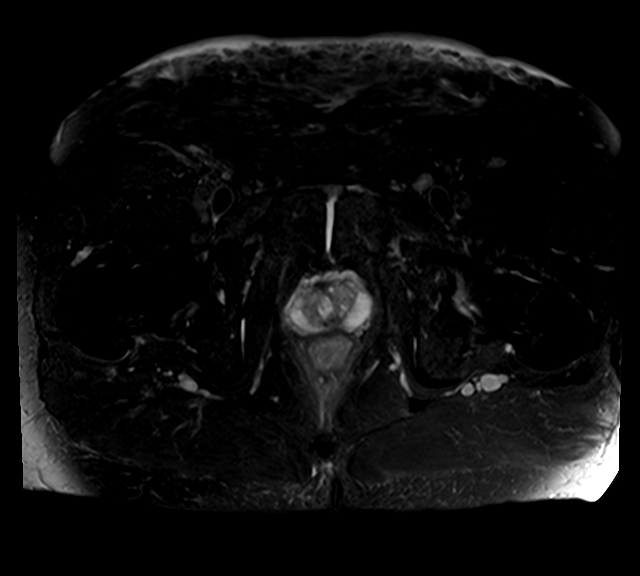
[im 20/23]
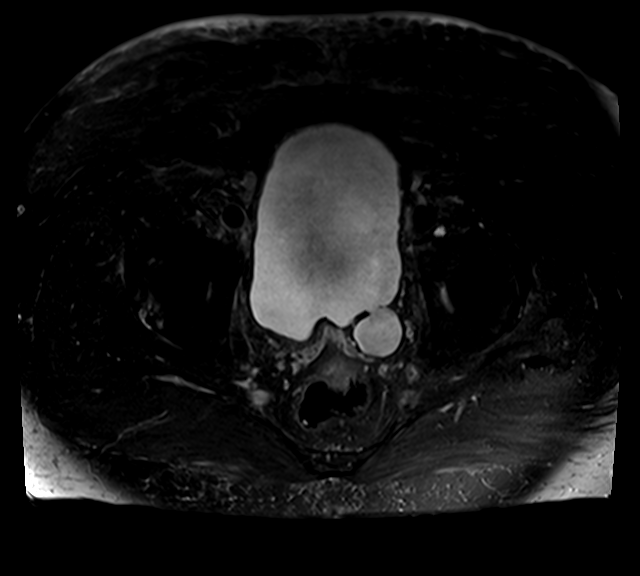

[Series 6: T1 · axial · 4.0mm · 0.59mm/px · z∈[+8,+92]mm · 3 of 23 slices shown (2 of 2)]
[im 3/23]
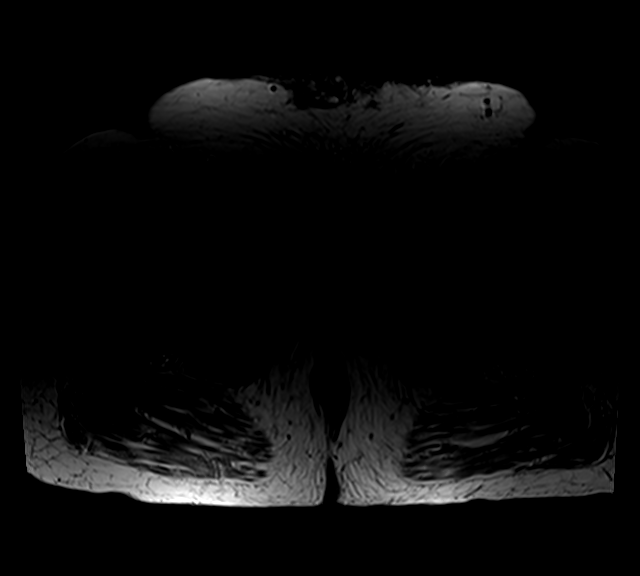
[im 12/23]
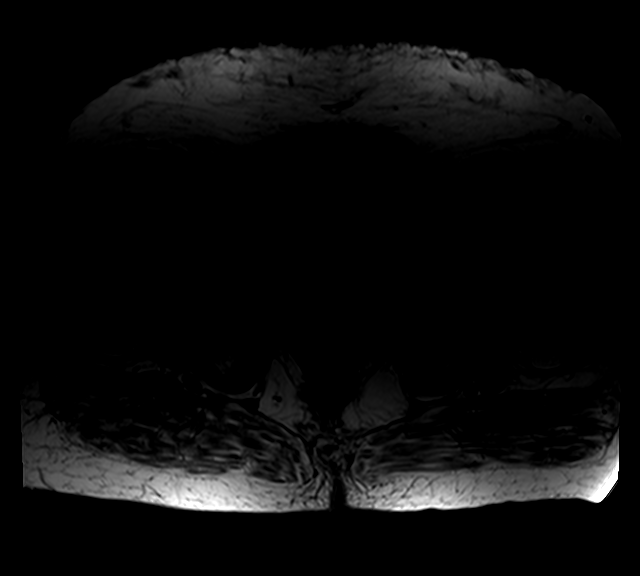
[im 20/23]
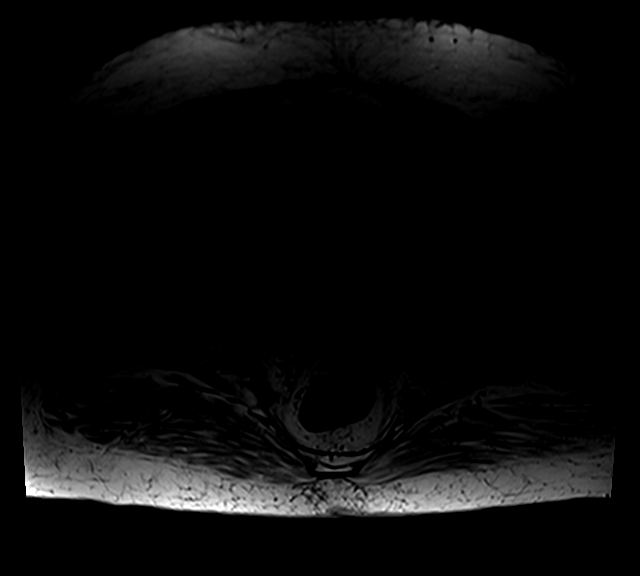

[Series 7: PD fat-sat · sagittal · 4.0mm · 0.33mm/px · 7 of 19 slices shown]
[im 1/19]
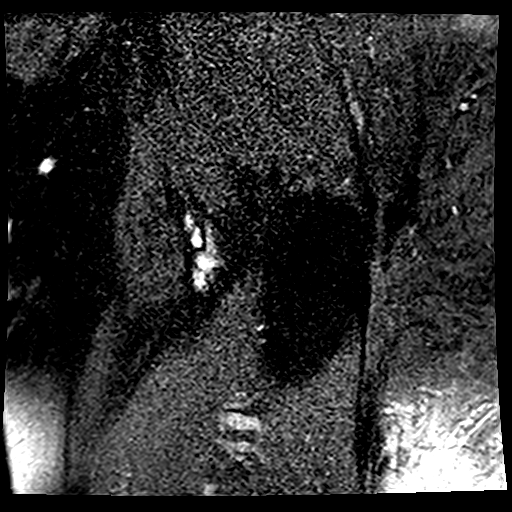
[im 4/19]
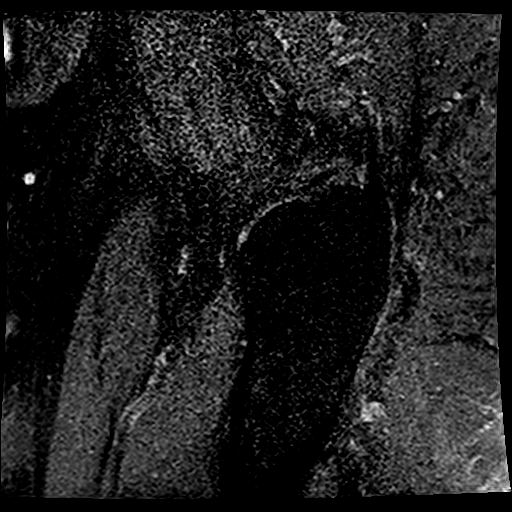
[im 7/19]
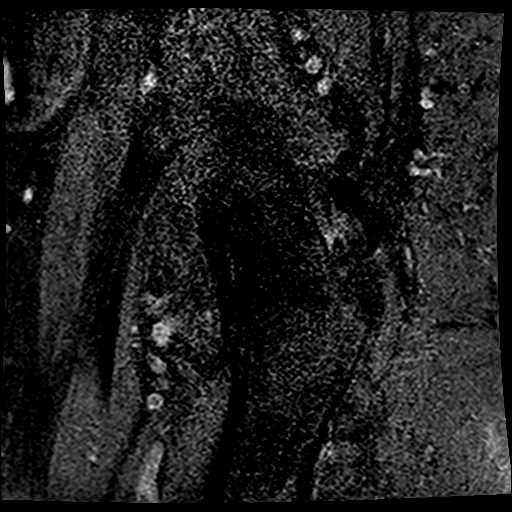
[im 10/19]
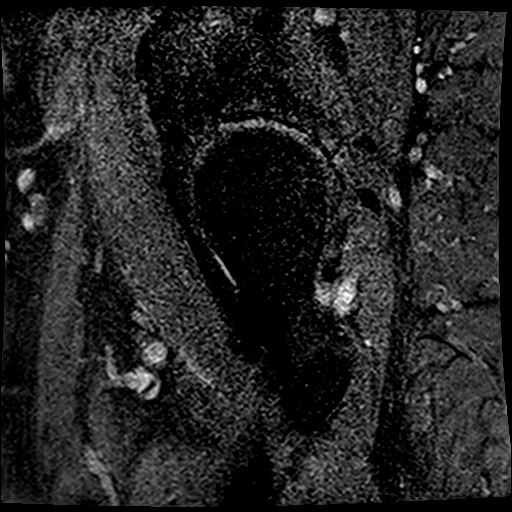
[im 13/19]
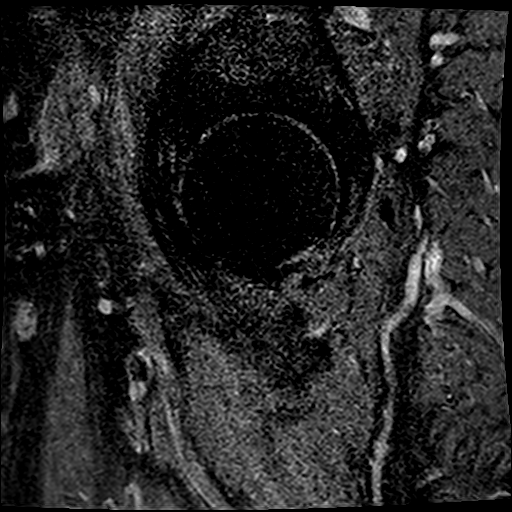
[im 16/19]
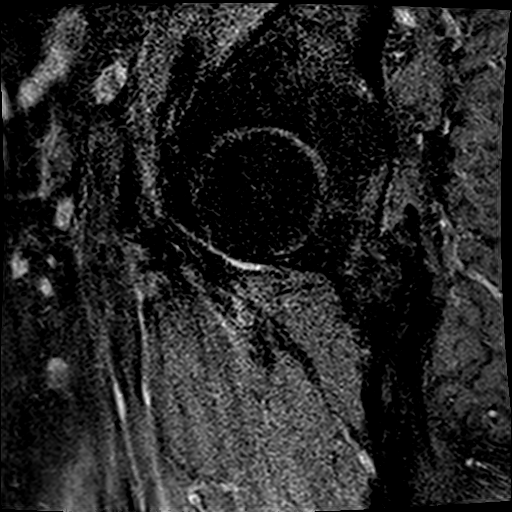
[im 19/19]
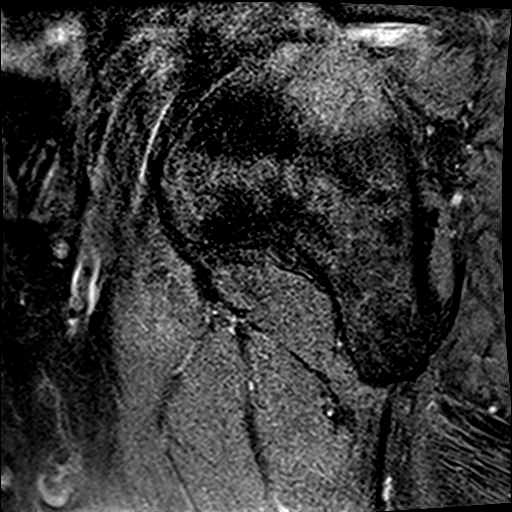

[16 of 40 positions shown; findings below may reference images not displayed]

FINDINGS: Bones: There is no fracture. No avascular necrosis of the femoral
heads is identified. No focal marrow lesion is seen. Hypertrophy
about the scratch the bony hypertrophy about the symphysis pubis
consistent with degenerative disease is identified.

Articular cartilage and labrum

Articular cartilage:  Mildly degenerated.

Labrum:  Degenerated without tear.

Joint or bursal effusion

Joint effusion:  None.

Bursae:  Unremarkable.

Muscles and tendons

Muscles and tendons:  Intact.

Other findings

Miscellaneous: Bladder diverticulum on the left is noted. Mild edema
in cutaneous and superficial subcutaneous fatty tissues are seen in
the anterior abdominal wall.
IMPRESSION: Negative for fracture.  No acute abnormality.

Mild degenerative change about the right hip. More advanced
degenerative change about the symphysis pubis is noted.

Lower lumbar spondylosis.

Edema in cutaneous and shallow subcutaneous fat of the anterior
abdominal wall is nonspecific but could be due to cellulitis.
